# Patient Record
Sex: Male | Born: 1960 | Race: White | Hispanic: No | Marital: Married | Smoking: Never smoker
Health system: Southern US, Community
[De-identification: ages and names within clinical notes are randomized; demographics above are authoritative.]

## PROBLEM LIST (undated history)

## (undated) DIAGNOSIS — Z973 Presence of spectacles and contact lenses: Secondary | ICD-10-CM

## (undated) DIAGNOSIS — Z87442 Personal history of urinary calculi: Secondary | ICD-10-CM

## (undated) DIAGNOSIS — C801 Malignant (primary) neoplasm, unspecified: Secondary | ICD-10-CM

## (undated) DIAGNOSIS — S62509A Fracture of unspecified phalanx of unspecified thumb, initial encounter for closed fracture: Secondary | ICD-10-CM

## (undated) DIAGNOSIS — F32A Depression, unspecified: Secondary | ICD-10-CM

## (undated) HISTORY — DX: Fracture of unspecified phalanx of unspecified thumb, initial encounter for closed fracture: S62.509A

## (undated) HISTORY — PX: OTHER SURGICAL HISTORY: SHX169

---

## 1993-03-01 HISTORY — PX: ORIF FINGER / THUMB FRACTURE: SUR932

## 2009-09-29 DIAGNOSIS — D229 Melanocytic nevi, unspecified: Secondary | ICD-10-CM

## 2009-09-29 HISTORY — DX: Melanocytic nevi, unspecified: D22.9

## 2014-05-10 ENCOUNTER — Encounter: Payer: Self-pay | Admitting: Internal Medicine

## 2014-07-12 ENCOUNTER — Ambulatory Visit (AMBULATORY_SURGERY_CENTER): Payer: Self-pay | Admitting: *Deleted

## 2014-07-12 VITALS — Ht 67.0 in | Wt 163.0 lb

## 2014-07-12 DIAGNOSIS — Z1211 Encounter for screening for malignant neoplasm of colon: Secondary | ICD-10-CM

## 2014-07-12 MED ORDER — MOVIPREP 100 G PO SOLR: 1.0000 | Freq: Once | ORAL | Status: DC

## 2014-07-12 NOTE — Progress Notes (Signed)
Denies allergies to eggs or soy products. Denies complications with sedation or anesthesia. Denies O2 use. Denies use of diet or weight loss medications.  Emmi instructions given for colonoscopy.  

## 2014-07-26 ENCOUNTER — Encounter: Payer: Self-pay | Admitting: Internal Medicine

## 2014-07-26 ENCOUNTER — Telehealth: Payer: Self-pay | Admitting: *Deleted

## 2014-07-26 ENCOUNTER — Other Ambulatory Visit (INDEPENDENT_AMBULATORY_CARE_PROVIDER_SITE_OTHER): Payer: Commercial Managed Care - PPO

## 2014-07-26 ENCOUNTER — Other Ambulatory Visit: Payer: Self-pay | Admitting: *Deleted

## 2014-07-26 ENCOUNTER — Emergency Department (HOSPITAL_COMMUNITY): Payer: Commercial Managed Care - PPO

## 2014-07-26 ENCOUNTER — Ambulatory Visit (INDEPENDENT_AMBULATORY_CARE_PROVIDER_SITE_OTHER)
Admission: RE | Admit: 2014-07-26 | Discharge: 2014-07-26 | Disposition: A | Discharge status: Home / Self Care | Payer: Commercial Managed Care - PPO | Source: Ambulatory Visit | Attending: Internal Medicine | Admitting: Internal Medicine

## 2014-07-26 ENCOUNTER — Emergency Department (HOSPITAL_COMMUNITY)
Admission: EM | Admit: 2014-07-26 | Discharge: 2014-07-26 | Disposition: A | Discharge status: Home / Self Care | Payer: Commercial Managed Care - PPO | Attending: Emergency Medicine | Admitting: Emergency Medicine

## 2014-07-26 ENCOUNTER — Ambulatory Visit (AMBULATORY_SURGERY_CENTER): Payer: Commercial Managed Care - PPO | Admitting: Internal Medicine

## 2014-07-26 ENCOUNTER — Encounter (HOSPITAL_COMMUNITY): Payer: Self-pay | Admitting: Emergency Medicine

## 2014-07-26 VITALS — BP 113/70 | HR 47 | Temp 96.0°F | Resp 37 | Ht 67.0 in | Wt 163.0 lb

## 2014-07-26 DIAGNOSIS — R109 Unspecified abdominal pain: Secondary | ICD-10-CM

## 2014-07-26 DIAGNOSIS — Z9889 Other specified postprocedural states: Secondary | ICD-10-CM

## 2014-07-26 DIAGNOSIS — Z8781 Personal history of (healed) traumatic fracture: Secondary | ICD-10-CM | POA: Diagnosis not present

## 2014-07-26 DIAGNOSIS — Z7982 Long term (current) use of aspirin: Secondary | ICD-10-CM | POA: Diagnosis not present

## 2014-07-26 DIAGNOSIS — R1030 Lower abdominal pain, unspecified: Secondary | ICD-10-CM | POA: Diagnosis present

## 2014-07-26 DIAGNOSIS — Z1211 Encounter for screening for malignant neoplasm of colon: Secondary | ICD-10-CM | POA: Diagnosis not present

## 2014-07-26 DIAGNOSIS — R1084 Generalized abdominal pain: Secondary | ICD-10-CM | POA: Diagnosis not present

## 2014-07-26 LAB — CBC WITH DIFFERENTIAL/PLATELET
Basophils Absolute: 0 10*3/uL (ref 0.0–0.1)
Basophils Relative: 0.6 % (ref 0.0–3.0)
EOS PCT: 7.8 % — AB (ref 0.0–5.0)
Eosinophils Absolute: 0.4 10*3/uL (ref 0.0–0.7)
HEMATOCRIT: 44.5 % (ref 39.0–52.0)
Hemoglobin: 15.2 g/dL (ref 13.0–17.0)
LYMPHS ABS: 1.5 10*3/uL (ref 0.7–4.0)
LYMPHS PCT: 27.1 % (ref 12.0–46.0)
MCHC: 34.1 g/dL (ref 30.0–36.0)
MCV: 91.9 fl (ref 78.0–100.0)
MONO ABS: 0.5 10*3/uL (ref 0.1–1.0)
Monocytes Relative: 8.5 % (ref 3.0–12.0)
NEUTROS PCT: 56 % (ref 43.0–77.0)
Neutro Abs: 3.2 10*3/uL (ref 1.4–7.7)
Platelets: 270 10*3/uL (ref 150.0–400.0)
RBC: 4.84 Mil/uL (ref 4.22–5.81)
RDW: 12.9 % (ref 11.5–15.5)
WBC: 5.7 10*3/uL (ref 4.0–10.5)

## 2014-07-26 MED ORDER — IOHEXOL 300 MG/ML  SOLN
100.0000 mL | Freq: Once | INTRAMUSCULAR | Status: AC | PRN
Start: 2014-07-26 — End: 2014-07-26
  Administered 2014-07-26: 100 mL via INTRAVENOUS

## 2014-07-26 MED ORDER — SODIUM CHLORIDE 0.9 % IV SOLN: 500.0000 mL | INTRAVENOUS | Status: DC

## 2014-07-26 MED ORDER — IOHEXOL 300 MG/ML  SOLN
50.0000 mL | Freq: Once | INTRAMUSCULAR | Status: AC | PRN
  Administered 2014-07-26: 50 mL via ORAL

## 2014-07-26 NOTE — Patient Instructions (Signed)
YOU HAD AN ENDOSCOPIC PROCEDURE TODAY AT [facility]:   Refer to the procedure report that was given to you for any specific questions about what was found during the examination.  If the procedure report does not answer your questions, please call your gastroenterologist to clarify.  If you requested that your care partner not be given the details of your procedure findings, then the procedure report has been included in a sealed envelope for you to review at your convenience later.  YOU SHOULD EXPECT: Some feelings of bloating in the abdomen. Passage of more gas than usual.  Walking can help get rid of the air that was put into your GI tract during the procedure and reduce the bloating. If you had a lower endoscopy (such as a colonoscopy or flexible sigmoidoscopy) you may notice spotting of blood in your stool or on the toilet paper. If you underwent a bowel prep for your procedure, you may not have a normal bowel movement for a few days.  Please Note:  You might notice some irritation and congestion in your nose or some drainage.  This is from the oxygen used during your procedure.  There is no need for concern and it should clear up in a day or so.  SYMPTOMS TO REPORT IMMEDIATELY:   Following lower endoscopy (colonoscopy or flexible sigmoidoscopy):  Excessive amounts of blood in the stool  Significant tenderness or worsening of abdominal pains  Swelling of the abdomen that is new, acute  Fever of 100F or higher   For urgent or emergent issues, a gastroenterologist can be reached at any hour by calling [phone].   DIET: Your first meal following the procedure should be a small meal and then it is ok to progress to your normal diet. Heavy or fried foods are harder to digest and may make you feel nauseous or bloated.  Likewise, meals heavy in dairy and vegetables can increase bloating.  Drink plenty of fluids but you should avoid alcoholic beverages for 24  hours.  ACTIVITY:  You should plan to take it easy for the rest of today and you should NOT DRIVE or use heavy machinery until tomorrow (because of the sedation medicines used during the test).    FOLLOW UP: Our staff will call the number listed on your records the next business day following your procedure to check on you and address any questions or concerns that you may have regarding the information given to you following your procedure. If we do not reach you, we will leave a message.  However, if you are feeling well and you are not experiencing any problems, there is no need to return our call.  We will assume that you have returned to your regular daily activities without incident.  If any biopsies were taken you will be contacted by phone or by letter within the next 1-3 weeks.  Please call us at [phone] if you have not heard about the biopsies in 3 weeks.    SIGNATURES/CONFIDENTIALITY: You and/or your care partner have signed paperwork which will be entered into your electronic medical record.  These signatures attest to the fact that that the information above on your After Visit Summary has been reviewed and is understood.  Full responsibility of the confidentiality of this discharge information lies with you and/or your care-partner.  High fiber diet information given.  Next colonoscopy 10 years-2026.

## 2014-07-26 NOTE — ED Notes (Signed)
Pt being sent by Olevia Perches MD w/ GI r/o perforated bowel.  C/o abdominal pain.  Pt had a colonoscopy this morning.  MD sent Pt for an abdominal xray which showed free air.

## 2014-07-26 NOTE — Progress Notes (Signed)
A/ox3 pleased with MAC, report to Jane RN 

## 2014-07-26 NOTE — Telephone Encounter (Signed)
Pt seen in ED by me. He is feeling much better, he expelled lot of air, and now feeling OK. CT scan of the abdomen shows no worrisome process. He was sent home from ED. I have left a message on his wife's phone about negative CT scan and gave him instructions to go ahead and resume regular diet

## 2014-07-26 NOTE — Op Note (Addendum)
[place]  [address] [place], [address]   COLONOSCOPY PROCEDURE REPORT  PATIENT: Scott Hines, Scott Hines  MR#: [id number] BIRTHDATE: Jun 13, 1960 , 54  yrs. old GENDER: male ENDOSCOPIST: Lafayette Dragon, MD REFERRED TZ:GYFVCB Ehinger, M.D. PROCEDURE DATE:  07/26/2014 PROCEDURE:   Colonoscopy, screening First Screening Colonoscopy - Avg.  risk and is 50 yrs.  old or older Yes.  Prior Negative Screening - Now for repeat screening. N/A  History of Adenoma - Now for follow-up colonoscopy & has been > or = to 3 yrs.  Yes hx of adenoma.  Has been 3 or more years since last colonoscopy.  Polyps removed today? No Recommend repeat exam, <10 yrs? No ASA CLASS:   Class I INDICATIONS:Screening for colonic neoplasia and Colorectal Neoplasm Risk Assessment for this procedure is average risk. MEDICATIONS: Monitored anesthesia care and Propofol 300 mg IV  DESCRIPTION OF PROCEDURE:   After the risks benefits and alternatives of the procedure were thoroughly explained, informed consent was obtained.  The digital rectal exam revealed no abnormalities of the rectum.   The LB [id number]-H190 [id number]  endoscope was introduced through the anus and advanced to the cecum, which was identified by both the appendix and ileocecal valve. No adverse events experienced.   The quality of the prep was good.  (MoviPrep was used)  The instrument was then slowly withdrawn as the colon was fully examined. Estimated blood loss is zero unless otherwise noted in this procedure report.      COLON FINDINGS: A normal appearing cecum, ileocecal valve, and appendiceal orifice were identified.  The ascending, transverse, descending, sigmoid colon, and rectum appeared unremarkable. This was a difficult procedure in that [place] was difficult to advance beyond hepatic flexure. Patient was turned in the left then right decubital position in order to advance the scope, abdominal pressure was applied Retroflexed views  revealed no abnormalities. The time to cecum = 15.29 Withdrawal time = 6.10   The scope was withdrawn and the procedure completed. COMPLICATIONS: There were no immediate complications.  ENDOSCOPIC IMPRESSION: Normal colonoscopy  RECOMMENDATIONS: High fiber diet Recall colonoscopy in 10 years following the procedure patient experience intermittent abdominal pain and was sent to the lab for CBC and KUB which showed the distended colon with overlapping loops of the small and large intestine with question of intra-luminal air into descending colon. Patient was asked to go to emergency room for CT scan of the abdomen but declined. We have instructed him and his wife to go to emergency room if the pain does not subside in a short period of time. His blood pressure and pulse were stable when he was discharged from recovery.  eSigned:  Lafayette Dragon, MD 07/26/2014 12:44 PM Revised: 07/26/2014 12:44 PM  cc:

## 2014-07-26 NOTE — Progress Notes (Signed)
BP   124/80 prior to discharge.

## 2014-07-26 NOTE — ED Notes (Addendum)
Patient comes from home after having a colonscopy this morning.  Patient had a lot of pain after the colonscopy and GI provider ordered a KUB which is concerning for free air.  Patient states he was in a great deal of pain earlier, but after having flatus he felt better.  Patient currently denies pain and N/V.  Patient's abdomen is soft and bowel sounds are hyperactive.

## 2014-07-26 NOTE — ED Notes (Signed)
Pt complaint of severe abdominal pain post colonoscopy this morning.

## 2014-07-26 NOTE — ED Provider Notes (Signed)
CSN: [id number]     Arrival date & time 07/26/14  1203 History   None    Chief Complaint  Patient presents with  . Abdominal Pain     (Consider location/radiation/quality/duration/timing/severity/associated sxs/prior Treatment) HPI Comments: Patient is a 54 year old male who presents with abdominal pain following a colonoscopy that he had this morning. The pain is located in his lower abdomen and does not radiate. The pain is described as pressure and moderate to severe. The pain started gradually and progressively worsened since the onset. Dr. Olevia Perches performed the colonoscopy and ordered a KUB which show distention but no free air. She sent the patient to the ED for a CT scan. No alleviating/aggravating factors. The patient has tried nothing for symptoms without relief. Associated symptoms include abdominal distention. Patient denies fever, headache, NVD, chest pain, SOB, dysuria, constipation.    Past Medical History  Diagnosis Date  . Thumb fracture     right   Past Surgical History  Procedure Laterality Date  . Orif finger / thumb fracture Right 1995   Family History  Problem Relation Age of Onset  . Colon polyps Sister   . Colon polyps Brother   . Colon cancer Neg Hx    History  Substance Use Topics  . Smoking status: Never Smoker   . Smokeless tobacco: Never Used  . Alcohol Use: 2.4 oz/week    4 Glasses of wine per week    Review of Systems  Constitutional: Negative for fever, chills and fatigue.  HENT: Negative for trouble swallowing.   Eyes: Negative for visual disturbance.  Respiratory: Negative for shortness of breath.   Cardiovascular: Negative for chest pain and palpitations.  Gastrointestinal: Positive for abdominal pain. Negative for nausea, vomiting and diarrhea.  Genitourinary: Negative for dysuria and difficulty urinating.  Musculoskeletal: Negative for arthralgias and neck pain.  Skin: Negative for color change.  Neurological: Negative for dizziness and  weakness.  Psychiatric/Behavioral: Negative for dysphoric mood.      Allergies  Review of patient's allergies indicates no known allergies.  Home Medications   Prior to Admission medications   Medication Sig Start Date End Date Taking? Authorizing Provider  aspirin 81 MG tablet Take 81 mg by mouth daily.   Yes Historical Provider, MD  ibuprofen (ADVIL,MOTRIN) 200 MG tablet Take 400 mg by mouth every 6 (six) hours as needed for headache, mild pain or moderate pain.   Yes Historical Provider, MD   BP 139/93 mmHg  Pulse 61  Temp(Src) 97.8 F (36.6 C) (Oral)  Resp 16  SpO2 98% Physical Exam  Constitutional: He is oriented to person, place, and time. He appears well-developed and well-nourished. No distress.  HENT:  Head: Normocephalic and atraumatic.  Eyes: Conjunctivae and EOM are normal.  Neck: Normal range of motion.  Cardiovascular: Normal rate and regular rhythm.  Exam reveals no gallop and no friction rub.   No murmur heard. Pulmonary/Chest: Effort normal and breath sounds normal. He has no wheezes. He has no rales. He exhibits no tenderness.  Abdominal: Soft. He exhibits distension. There is tenderness. There is no rebound.  Mild LLQ and RLQ tenderness to palpation. Abdominal distension noted. No peritoneal signs.   Musculoskeletal: Normal range of motion.  Neurological: He is alert and oriented to person, place, and time. Coordination normal.  Speech is goal-oriented. Moves limbs without ataxia.   Skin: Skin is warm and dry.  Psychiatric: He has a normal mood and affect. His behavior is normal.  Nursing note and  vitals reviewed.   ED Course  Procedures (including critical care time) Labs Review Labs Reviewed - No data to display  Imaging Review Dg Abd 1 View  07/26/2014   CLINICAL DATA:  Left lower quadrant discomfort following colonoscopy earlier today  EXAM: ABDOMEN - 1 VIEW  COMPARISON:  None.  FINDINGS: There is moderate gaseous distention of the entire colon.  A small amount of small bowel gas is demonstrated as well on the left there tiny gas collections adjacent to the descending colon which may lie in overlapping loops of small bowel or possibly be intramural air. There is gas in the rectum. Hemidiaphragms are not included in the field of view.  IMPRESSION: There is significant gaseous distention of the colon. Definite free extraluminal collections are not demonstrated but intramural gas in the descending colon may be present. CT scanning of the abdomen and pelvis is recommended.  These results were called by telephone at the time of interpretation on 07/26/2014 at 11:26 am to [facility], RN, who verbally acknowledged these results.   Electronically Signed   By: David  Martinique M.D.   On: 07/26/2014 11:27     EKG Interpretation None      MDM   Final diagnoses:  Generalized abdominal pain  Status post colonoscopy    12:18 PM Labs pending.   2:17 PM CT abdomen pelvis unremarkable for acute changes. Patient is pain free and will be discharged. Vitals stable and patient afebrile.   Alvina Chou, PA-C 07/26/14 [place], MD 07/26/14 1421

## 2014-07-26 NOTE — ED Notes (Signed)
Patient transported to CT 

## 2014-07-26 NOTE — Telephone Encounter (Signed)
Told Ms. Lanae Boast to take patient to [facility] ED for evaluation and possible CT scan as per Dr. Olevia Perches. ER triage made aware patient is coming.

## 2014-07-26 NOTE — Progress Notes (Signed)
1045 pt. Complains of mid abdominal pain and has not passed a lot of air.  Abdomen distended and firm.  Dr. Olevia Perches in to examine pt.   Will go to lab for CBC And KUB Stat.  Pt. To go home, but will call back if needed. Will advise pt. Of results of labs and KUB asap.

## 2014-07-30 ENCOUNTER — Telehealth: Payer: Self-pay | Admitting: *Deleted

## 2014-07-30 NOTE — Telephone Encounter (Signed)
  Follow up Call-no answer, left message to call if questions or concerns.     

## 2017-01-18 DIAGNOSIS — D229 Melanocytic nevi, unspecified: Secondary | ICD-10-CM | POA: Diagnosis not present

## 2017-01-18 DIAGNOSIS — L739 Follicular disorder, unspecified: Secondary | ICD-10-CM | POA: Diagnosis not present

## 2017-01-18 DIAGNOSIS — D492 Neoplasm of unspecified behavior of bone, soft tissue, and skin: Secondary | ICD-10-CM | POA: Diagnosis not present

## 2017-01-18 DIAGNOSIS — L821 Other seborrheic keratosis: Secondary | ICD-10-CM | POA: Diagnosis not present

## 2017-01-18 DIAGNOSIS — L820 Inflamed seborrheic keratosis: Secondary | ICD-10-CM | POA: Diagnosis not present

## 2017-01-27 DIAGNOSIS — F332 Major depressive disorder, recurrent severe without psychotic features: Secondary | ICD-10-CM | POA: Diagnosis not present

## 2017-02-03 DIAGNOSIS — F332 Major depressive disorder, recurrent severe without psychotic features: Secondary | ICD-10-CM | POA: Diagnosis not present

## 2017-03-15 DIAGNOSIS — F332 Major depressive disorder, recurrent severe without psychotic features: Secondary | ICD-10-CM | POA: Diagnosis not present

## 2017-07-04 DIAGNOSIS — F332 Major depressive disorder, recurrent severe without psychotic features: Secondary | ICD-10-CM | POA: Diagnosis not present

## 2017-08-01 DIAGNOSIS — F332 Major depressive disorder, recurrent severe without psychotic features: Secondary | ICD-10-CM | POA: Diagnosis not present

## 2017-08-15 DIAGNOSIS — F332 Major depressive disorder, recurrent severe without psychotic features: Secondary | ICD-10-CM | POA: Diagnosis not present

## 2017-08-26 DIAGNOSIS — F332 Major depressive disorder, recurrent severe without psychotic features: Secondary | ICD-10-CM | POA: Diagnosis not present

## 2018-01-17 DIAGNOSIS — F332 Major depressive disorder, recurrent severe without psychotic features: Secondary | ICD-10-CM | POA: Diagnosis not present

## 2018-02-16 DIAGNOSIS — E7800 Pure hypercholesterolemia, unspecified: Secondary | ICD-10-CM | POA: Diagnosis not present

## 2018-02-16 DIAGNOSIS — Z125 Encounter for screening for malignant neoplasm of prostate: Secondary | ICD-10-CM | POA: Diagnosis not present

## 2018-02-16 DIAGNOSIS — M792 Neuralgia and neuritis, unspecified: Secondary | ICD-10-CM | POA: Diagnosis not present

## 2018-02-16 DIAGNOSIS — Z0000 Encounter for general adult medical examination without abnormal findings: Secondary | ICD-10-CM | POA: Diagnosis not present

## 2018-10-04 DIAGNOSIS — L821 Other seborrheic keratosis: Secondary | ICD-10-CM | POA: Diagnosis not present

## 2018-10-04 DIAGNOSIS — D229 Melanocytic nevi, unspecified: Secondary | ICD-10-CM | POA: Diagnosis not present

## 2018-10-04 DIAGNOSIS — L728 Other follicular cysts of the skin and subcutaneous tissue: Secondary | ICD-10-CM | POA: Diagnosis not present

## 2018-12-13 DIAGNOSIS — Z23 Encounter for immunization: Secondary | ICD-10-CM | POA: Diagnosis not present

## 2018-12-18 DIAGNOSIS — M25512 Pain in left shoulder: Secondary | ICD-10-CM | POA: Diagnosis not present

## 2019-01-01 DIAGNOSIS — M25512 Pain in left shoulder: Secondary | ICD-10-CM | POA: Diagnosis not present

## 2019-09-10 ENCOUNTER — Encounter: Payer: Self-pay | Admitting: *Deleted

## 2019-09-18 ENCOUNTER — Ambulatory Visit (INDEPENDENT_AMBULATORY_CARE_PROVIDER_SITE_OTHER): Payer: 59 | Admitting: Dermatology

## 2019-09-18 ENCOUNTER — Other Ambulatory Visit: Payer: Self-pay

## 2019-09-18 ENCOUNTER — Encounter: Payer: Self-pay | Admitting: Dermatology

## 2019-09-18 DIAGNOSIS — L814 Other melanin hyperpigmentation: Secondary | ICD-10-CM | POA: Diagnosis not present

## 2019-09-18 DIAGNOSIS — D225 Melanocytic nevi of trunk: Secondary | ICD-10-CM

## 2019-09-18 DIAGNOSIS — D229 Melanocytic nevi, unspecified: Secondary | ICD-10-CM

## 2019-09-18 DIAGNOSIS — Z1283 Encounter for screening for malignant neoplasm of skin: Secondary | ICD-10-CM | POA: Diagnosis not present

## 2019-09-18 MED ORDER — TERBINAFINE HCL 1 % EX CREA: TOPICAL_CREAM | CUTANEOUS | 1 refills | Status: DC

## 2019-10-19 ENCOUNTER — Encounter: Payer: Self-pay | Admitting: Dermatology

## 2019-10-19 NOTE — Progress Notes (Signed)
   Follow-Up Visit   Subjective  Scott Hines is a 59 y.o. male who presents for the following: Annual Exam (irritation on small toe left foot, has treated with over the counter athletes foot spray but has not helped, has been present for 2 months now).  General skin examination Location:  Duration:  Quality: Without known changes in moles. Associated Signs/Symptoms: Modifying Factors:  Severity:  Timing: Context: Also wants his athlete's foot checked.  Objective  Well appearing patient in no apparent distress; mood and affect are within normal limits.  A full examination was performed including scalp, head, eyes, ears, nose, lips, neck, chest, axillae, abdomen, back, buttocks, bilateral upper extremities, bilateral lower extremities, hands, feet, fingers, toes, fingernails, and toenails. All findings within normal limits unless otherwise noted below.   Assessment & Plan    Solar lentigo Mid Frontal Scalp  Recheck if any clinical change.  Did discuss biopsy but for now patient prefers to watch.  Nevus Mid Back  Self examine twice annually, dermatologist examination yearly.  Skin cancer screening performed today.    I, Lavonna Monarch, MD, have reviewed all documentation for this visit.  The documentation on 10/19/19 for the exam, diagnosis, procedures, and orders are all accurate and complete.

## 2021-06-11 ENCOUNTER — Ambulatory Visit
Admission: RE | Admit: 2021-06-11 | Discharge: 2021-06-11 | Disposition: A | Discharge status: Home / Self Care | Payer: 59 | Source: Ambulatory Visit | Attending: Family Medicine | Admitting: Family Medicine

## 2021-06-11 ENCOUNTER — Other Ambulatory Visit: Payer: Self-pay | Admitting: Family Medicine

## 2021-06-11 DIAGNOSIS — R109 Unspecified abdominal pain: Secondary | ICD-10-CM | POA: Diagnosis not present

## 2021-06-24 ENCOUNTER — Other Ambulatory Visit: Payer: Self-pay | Admitting: Family Medicine

## 2021-06-24 DIAGNOSIS — N200 Calculus of kidney: Secondary | ICD-10-CM

## 2021-06-25 ENCOUNTER — Inpatient Hospital Stay: Admission: RE | Admit: 2021-06-25 | Payer: 59 | Source: Ambulatory Visit

## 2021-07-01 ENCOUNTER — Ambulatory Visit
Admission: RE | Admit: 2021-07-01 | Discharge: 2021-07-01 | Disposition: A | Discharge status: Home / Self Care | Payer: 59 | Source: Ambulatory Visit | Attending: Family Medicine | Admitting: Family Medicine

## 2021-07-01 DIAGNOSIS — N200 Calculus of kidney: Secondary | ICD-10-CM

## 2021-07-01 DIAGNOSIS — N1330 Unspecified hydronephrosis: Secondary | ICD-10-CM | POA: Diagnosis not present

## 2021-10-13 DIAGNOSIS — N130 Hydronephrosis with ureteropelvic junction obstruction: Secondary | ICD-10-CM | POA: Diagnosis not present

## 2021-10-13 DIAGNOSIS — R972 Elevated prostate specific antigen [PSA]: Secondary | ICD-10-CM | POA: Diagnosis not present

## 2021-10-16 ENCOUNTER — Other Ambulatory Visit: Payer: Self-pay | Admitting: Urology

## 2021-10-16 DIAGNOSIS — N1330 Unspecified hydronephrosis: Secondary | ICD-10-CM

## 2021-10-27 ENCOUNTER — Ambulatory Visit
Admission: RE | Admit: 2021-10-27 | Discharge: 2021-10-27 | Disposition: A | Discharge status: Home / Self Care | Payer: 59 | Source: Ambulatory Visit | Attending: Urology | Admitting: Urology

## 2021-10-27 DIAGNOSIS — N1330 Unspecified hydronephrosis: Secondary | ICD-10-CM

## 2021-10-27 DIAGNOSIS — N200 Calculus of kidney: Secondary | ICD-10-CM | POA: Diagnosis not present

## 2021-10-27 DIAGNOSIS — K760 Fatty (change of) liver, not elsewhere classified: Secondary | ICD-10-CM | POA: Diagnosis not present

## 2021-11-05 ENCOUNTER — Other Ambulatory Visit: Payer: Self-pay | Admitting: Urology

## 2021-11-05 ENCOUNTER — Other Ambulatory Visit (HOSPITAL_COMMUNITY): Payer: Self-pay | Admitting: Urology

## 2021-11-05 DIAGNOSIS — R972 Elevated prostate specific antigen [PSA]: Secondary | ICD-10-CM

## 2021-11-06 ENCOUNTER — Other Ambulatory Visit: Payer: Self-pay | Admitting: Urology

## 2021-11-18 NOTE — Progress Notes (Signed)
Left message with zona at dr Louis Meckel office, pt has been left voice mail message x 4 and has not called back to pre op nurse to do medical history, instructions etc for 11-27-2021 surgery.

## 2021-11-19 NOTE — Progress Notes (Signed)
Zona at dr Louis Meckel office called, no other contact numbers for patient

## 2021-11-20 NOTE — Progress Notes (Signed)
Left message with Zona at dr Louis Meckel, pt has not returned pre op call for 11-27-2021 surgery.

## 2021-11-24 ENCOUNTER — Other Ambulatory Visit: Payer: Self-pay | Admitting: Urology

## 2021-11-24 ENCOUNTER — Other Ambulatory Visit: Payer: Self-pay

## 2021-11-24 ENCOUNTER — Encounter (HOSPITAL_BASED_OUTPATIENT_CLINIC_OR_DEPARTMENT_OTHER): Payer: Self-pay | Admitting: Urology

## 2021-11-24 NOTE — Progress Notes (Signed)
Spoke w/ via phone for pre-op interview---pt Lab needs dos----none               Lab results------none COVID test -----patient states asymptomatic no test needed Arrive at -------530 am  NPO after MN NO Solid Food.  Clear liquids from MN until---430 am Med rec completed Medications to take morning of surgery -----none Diabetic medication ----- Patient instructed no nail polish to be worn day of surgery Patient instructed to bring photo id and insurance card day of surgery Patient aware to have Driver (ride ) / caregiver    for 24 hours after surgery  Patient Special Instructions -----fleets enema am of surgery per dr Louis Meckel instructions Pre-Op special Istructions -----none Patient verbalized understanding of instructions that were given at this phone interview. Patient denies shortness of breath, chest pain, fever, cough at this phone interview.

## 2021-11-26 NOTE — Anesthesia Preprocedure Evaluation (Addendum)
Anesthesia Evaluation  Patient identified by MRN, date of birth, ID band Patient awake    Reviewed: Allergy & Precautions, NPO status , Patient's Chart, lab work & pertinent test results  Airway Mallampati: I  TM Distance: >3 FB Neck ROM: Full    Dental no notable dental hx.    Pulmonary neg pulmonary ROS,    Pulmonary exam normal        Cardiovascular negative cardio ROS Normal cardiovascular exam     Neuro/Psych negative neurological ROS  negative psych ROS   GI/Hepatic negative GI ROS, Neg liver ROS,   Endo/Other  negative endocrine ROS  Renal/GU negative Renal ROS     Musculoskeletal negative musculoskeletal ROS (+)   Abdominal   Peds  Hematology negative hematology ROS (+)   Anesthesia Other Findings RIGHT DISTAL URETERAL STONE/ELEVATED PSA  Reproductive/Obstetrics                            Anesthesia Physical Anesthesia Plan  ASA: 2  Anesthesia Plan: General   Post-op Pain Management:    Induction: Intravenous  PONV Risk Score and Plan: 2 and Ondansetron, Dexamethasone, Midazolam and Treatment may vary due to age or medical condition  Airway Management Planned: LMA  Additional Equipment:   Intra-op Plan:   Post-operative Plan: Extubation in OR  Informed Consent: I have reviewed the patients History and Physical, chart, labs and discussed the procedure including the risks, benefits and alternatives for the proposed anesthesia with the patient or authorized representative who has indicated his/her understanding and acceptance.     Dental advisory given  Plan Discussed with: CRNA  Anesthesia Plan Comments:         Anesthesia Quick Evaluation

## 2021-11-27 ENCOUNTER — Encounter (HOSPITAL_BASED_OUTPATIENT_CLINIC_OR_DEPARTMENT_OTHER)
Admission: RE | Disposition: A | Discharge status: Home / Self Care | Payer: Self-pay | Source: Home / Self Care | Attending: Urology

## 2021-11-27 ENCOUNTER — Ambulatory Visit (HOSPITAL_COMMUNITY)
Admission: RE | Admit: 2021-11-27 | Discharge: 2021-11-27 | Disposition: A | Discharge status: Home / Self Care | Payer: 59 | Source: Ambulatory Visit | Attending: Urology | Admitting: Urology

## 2021-11-27 ENCOUNTER — Encounter (HOSPITAL_BASED_OUTPATIENT_CLINIC_OR_DEPARTMENT_OTHER): Payer: Self-pay | Admitting: Urology

## 2021-11-27 ENCOUNTER — Ambulatory Visit (HOSPITAL_BASED_OUTPATIENT_CLINIC_OR_DEPARTMENT_OTHER): Payer: 59 | Admitting: Anesthesiology

## 2021-11-27 ENCOUNTER — Ambulatory Visit (HOSPITAL_BASED_OUTPATIENT_CLINIC_OR_DEPARTMENT_OTHER)
Admission: RE | Admit: 2021-11-27 | Discharge: 2021-11-27 | Disposition: A | Discharge status: Home / Self Care | Payer: 59 | Attending: Urology | Admitting: Urology

## 2021-11-27 DIAGNOSIS — N201 Calculus of ureter: Secondary | ICD-10-CM

## 2021-11-27 DIAGNOSIS — R972 Elevated prostate specific antigen [PSA]: Secondary | ICD-10-CM

## 2021-11-27 DIAGNOSIS — C61 Malignant neoplasm of prostate: Secondary | ICD-10-CM | POA: Insufficient documentation

## 2021-11-27 DIAGNOSIS — Z01818 Encounter for other preprocedural examination: Secondary | ICD-10-CM

## 2021-11-27 DIAGNOSIS — D075 Carcinoma in situ of prostate: Secondary | ICD-10-CM | POA: Diagnosis not present

## 2021-11-27 DIAGNOSIS — N200 Calculus of kidney: Secondary | ICD-10-CM

## 2021-11-27 DIAGNOSIS — N132 Hydronephrosis with renal and ureteral calculous obstruction: Secondary | ICD-10-CM | POA: Diagnosis not present

## 2021-11-27 HISTORY — DX: Personal history of urinary calculi: Z87.442

## 2021-11-27 HISTORY — DX: Presence of spectacles and contact lenses: Z97.3

## 2021-11-27 HISTORY — PX: TRANSRECTAL ULTRASOUND: SHX5146

## 2021-11-27 HISTORY — PX: CYSTOSCOPY/URETEROSCOPY/HOLMIUM LASER/STENT PLACEMENT: SHX6546

## 2021-11-27 SURGERY — CYSTOSCOPY/URETEROSCOPY/HOLMIUM LASER/STENT PLACEMENT
Anesthesia: General | Site: Ureter | Laterality: Right

## 2021-11-27 MED ORDER — KETOROLAC TROMETHAMINE 30 MG/ML IJ SOLN: 30.0000 mg | Freq: Once | INTRAMUSCULAR | Status: DC | PRN

## 2021-11-27 MED ORDER — OXYCODONE HCL 5 MG/5ML PO SOLN: 5.0000 mg | Freq: Once | ORAL | Status: DC | PRN

## 2021-11-27 MED ORDER — FENTANYL CITRATE (PF) 100 MCG/2ML IJ SOLN: 25.0000 ug | INTRAMUSCULAR | Status: DC | PRN

## 2021-11-27 MED ORDER — ACETAMINOPHEN 500 MG PO TABS
1000.0000 mg | ORAL_TABLET | Freq: Once | ORAL | Status: AC
  Administered 2021-11-27: 1000 mg via ORAL

## 2021-11-27 MED ORDER — FENTANYL CITRATE (PF) 250 MCG/5ML IJ SOLN
INTRAMUSCULAR | Status: DC | PRN
  Administered 2021-11-27 (×2): 25 ug via INTRAVENOUS
  Administered 2021-11-27: 50 ug via INTRAVENOUS

## 2021-11-27 MED ORDER — MIDAZOLAM HCL 2 MG/2ML IJ SOLN
INTRAMUSCULAR | Status: DC | PRN
  Administered 2021-11-27: 2 mg via INTRAVENOUS

## 2021-11-27 MED ORDER — TRAMADOL HCL 50 MG PO TABS: 50.0000 mg | ORAL_TABLET | Freq: Four times a day (QID) | ORAL | 0 refills | Status: DC | PRN

## 2021-11-27 MED ORDER — TAMSULOSIN HCL 0.4 MG PO CAPS: 0.4000 mg | ORAL_CAPSULE | Freq: Every day | ORAL | 0 refills | Status: DC

## 2021-11-27 MED ORDER — LIDOCAINE 2% (20 MG/ML) 5 ML SYRINGE
INTRAMUSCULAR | Status: DC | PRN
  Administered 2021-11-27: 60 mg via INTRAVENOUS

## 2021-11-27 MED ORDER — PROPOFOL 10 MG/ML IV BOLUS
INTRAVENOUS | Status: AC
  Filled 2021-11-27: qty 20

## 2021-11-27 MED ORDER — ACETAMINOPHEN 500 MG PO TABS
ORAL_TABLET | ORAL | Status: AC
  Filled 2021-11-27: qty 2

## 2021-11-27 MED ORDER — PROPOFOL 10 MG/ML IV BOLUS
INTRAVENOUS | Status: DC | PRN
  Administered 2021-11-27: 200 mg via INTRAVENOUS

## 2021-11-27 MED ORDER — SODIUM CHLORIDE 0.9 % IV SOLN
INTRAVENOUS | Status: AC
  Filled 2021-11-27: qty 100

## 2021-11-27 MED ORDER — SODIUM CHLORIDE 0.9 % IR SOLN
Status: DC | PRN
  Administered 2021-11-27: 3000 mL via INTRAVESICAL

## 2021-11-27 MED ORDER — AMISULPRIDE (ANTIEMETIC) 5 MG/2ML IV SOLN: 10.0000 mg | Freq: Once | INTRAVENOUS | Status: DC | PRN

## 2021-11-27 MED ORDER — SODIUM CHLORIDE 0.9 % IV SOLN
2.0000 g | INTRAVENOUS | Status: AC
  Administered 2021-11-27: 2 g via INTRAVENOUS

## 2021-11-27 MED ORDER — PROMETHAZINE HCL 25 MG/ML IJ SOLN: 6.2500 mg | INTRAMUSCULAR | Status: DC | PRN

## 2021-11-27 MED ORDER — ONDANSETRON HCL 4 MG/2ML IJ SOLN
INTRAMUSCULAR | Status: DC | PRN
  Administered 2021-11-27: 4 mg via INTRAVENOUS

## 2021-11-27 MED ORDER — MIDAZOLAM HCL 2 MG/2ML IJ SOLN
INTRAMUSCULAR | Status: AC
  Filled 2021-11-27: qty 2

## 2021-11-27 MED ORDER — FENTANYL CITRATE (PF) 100 MCG/2ML IJ SOLN
INTRAMUSCULAR | Status: AC
  Filled 2021-11-27: qty 2

## 2021-11-27 MED ORDER — CIPROFLOXACIN HCL 500 MG PO TABS: 500.0000 mg | ORAL_TABLET | Freq: Once | ORAL | 0 refills | Status: AC

## 2021-11-27 MED ORDER — DEXAMETHASONE SODIUM PHOSPHATE 10 MG/ML IJ SOLN
INTRAMUSCULAR | Status: DC | PRN
  Administered 2021-11-27: 10 mg via INTRAVENOUS

## 2021-11-27 MED ORDER — CEFTRIAXONE SODIUM 2 G IJ SOLR
INTRAMUSCULAR | Status: AC
  Filled 2021-11-27: qty 20

## 2021-11-27 MED ORDER — SODIUM CHLORIDE 0.9 % IV SOLN: INTRAVENOUS | Status: DC

## 2021-11-27 MED ORDER — IOHEXOL 300 MG/ML  SOLN
INTRAMUSCULAR | Status: DC | PRN
  Administered 2021-11-27: 10 mL

## 2021-11-27 MED ORDER — OXYCODONE HCL 5 MG PO TABS: 5.0000 mg | ORAL_TABLET | Freq: Once | ORAL | Status: DC | PRN

## 2021-11-27 SURGICAL SUPPLY — 26 items
APL SKNCLS STERI-STRIP NONHPOA (GAUZE/BANDAGES/DRESSINGS) ×2
BAG DRAIN URO-CYSTO SKYTR STRL (DRAIN) ×2 IMPLANT
BAG DRN UROCATH (DRAIN) ×2
BASKET LASER NITINOL 1.9FR (BASKET) IMPLANT
BASKET STONE 1.7 NGAGE (UROLOGICAL SUPPLIES) IMPLANT
BENZOIN TINCTURE PRP APPL 2/3 (GAUZE/BANDAGES/DRESSINGS) IMPLANT
BSKT STON RTRVL 120 1.9FR (BASKET)
CATH URETERAL DUAL LUMEN 10F (MISCELLANEOUS) IMPLANT
CATH URETL OPEN 5X70 (CATHETERS) ×2 IMPLANT
CLOTH BEACON ORANGE TIMEOUT ST (SAFETY) ×2 IMPLANT
DRSG TEGADERM 2-3/8X2-3/4 SM (GAUZE/BANDAGES/DRESSINGS) IMPLANT
EXTRACTOR STONE 1.7FRX115CM (UROLOGICAL SUPPLIES) IMPLANT
GLOVE BIO SURGEON STRL SZ7.5 (GLOVE) ×2 IMPLANT
GOWN STRL REUS W/TWL XL LVL3 (GOWN DISPOSABLE) ×2 IMPLANT
GUIDEWIRE STR DUAL SENSOR (WIRE) ×2 IMPLANT
INST BIOPSY MAXCORE 18GX25 (NEEDLE) IMPLANT
IV NS IRRIG 3000ML ARTHROMATIC (IV SOLUTION) ×4 IMPLANT
KIT TURNOVER CYSTO (KITS) ×2 IMPLANT
MANIFOLD NEPTUNE II (INSTRUMENTS) ×2 IMPLANT
NS IRRIG 500ML POUR BTL (IV SOLUTION) ×2 IMPLANT
PACK CYSTO (CUSTOM PROCEDURE TRAY) ×2 IMPLANT
STENT URET 6FRX26 CONTOUR (STENTS) IMPLANT
TRACTIP FLEXIVA PULS ID 200XHI (Laser) IMPLANT
TRACTIP FLEXIVA PULSE ID 200 (Laser) ×2
TUBE CONNECTING 12X1/4 (SUCTIONS) IMPLANT
TUBING UROLOGY SET (TUBING) ×2 IMPLANT

## 2021-11-27 NOTE — Transfer of Care (Signed)
Immediate Anesthesia Transfer of Care Note  Patient: Carver Fila  Procedure(s) Performed: RIGHT URETEROSCOPY/HOLMIUM LASER/STONE REMOVAL, RIGHT URETERAL STENT PLACEMENT (Right: Ureter) TRANSRECTAL ULTRASOUND-PROSTATE BIOPSY (Prostate)  Patient Location: PACU  Anesthesia Type:General  Level of Consciousness: drowsy and patient cooperative  Airway & Oxygen Therapy: Patient Spontanous Breathing  Post-op Assessment: Report given to RN and Post -op Vital signs reviewed and stable  Post vital signs: Reviewed and stable  Last Vitals:  Vitals Value Taken Time  BP    Temp    Pulse 56 11/27/21 0848  Resp 11 11/27/21 0848  SpO2 100 % 11/27/21 0848    Last Pain:  Vitals:   11/27/21 0550  TempSrc: Oral  PainSc: 0-No pain      Patients Stated Pain Goal: 5 (16/10/96 0454)  Complications: No notable events documented.

## 2021-11-27 NOTE — Anesthesia Postprocedure Evaluation (Signed)
Anesthesia Post Note  Patient: Scott Hines  Procedure(s) Performed: RIGHT URETEROSCOPY/HOLMIUM LASER/STONE REMOVAL, RIGHT URETERAL STENT PLACEMENT (Right: Ureter) TRANSRECTAL ULTRASOUND-PROSTATE BIOPSY (Prostate)     Patient location during evaluation: PACU Anesthesia Type: General Level of consciousness: awake Pain management: pain level controlled Vital Signs Assessment: post-procedure vital signs reviewed and stable Respiratory status: spontaneous breathing, nonlabored ventilation, respiratory function stable and patient connected to nasal cannula oxygen Cardiovascular status: blood pressure returned to baseline and stable Postop Assessment: no apparent nausea or vomiting Anesthetic complications: no   No notable events documented.  Last Vitals:  Vitals:   11/27/21 1017 11/27/21 1045  BP: (!) 139/91   Pulse: (!) 51   Resp: 18   Temp:  36.4 C  SpO2: 100%     Last Pain:  Vitals:   11/27/21 0915  TempSrc:   PainSc: 0-No pain                 Ande Therrell P Wilhelmena Zea

## 2021-11-27 NOTE — Op Note (Signed)
Preoperative diagnosis:  Elevated PSA Right distal ureteral stone  Postoperative diagnosis:  Same  Procedure: Transrectal ultrasound guided 12 core prostate biopsy Cystoscopy, right retrograde pyelogram with interpretation, right ureteroscopy, laser lithotripsy, stone extraction, right ureteral stent placement  Surgeon: Ardis Hughs, MD  Anesthesia: General  Complications: None  Intraoperative findings:  1: The patient's prostate was approximately 28 g, with a small median lobe.  There were no obvious hypoechoic areas.  12 cores were taken in the routine pattern. #2: The patient's stone was in the right distal ureter and fragmented and removed entirely.  A 26 cm stent was placed. #3: The right retrograde pyelogram demonstrated a filling defect in the distal ureter just proximal to the UVJ, but there was no hydroureteronephrosis.  EBL: Minimal  Specimens:  #1: 12 core needle biopsies of the prostate including bilateral medial and lateral base, medial and lateral mid, medial and apex. #2: Right distal ureteral stone  Indication: Scott Hines is a 61 y.o. patient with right distal ureteral stone otherwise fairly asymptomatic but noted on a CT scan performed after the ultrasound demonstrated mild hydronephrosis.  In addition, the patient had an elevated PSA and needed a prostate biopsy.  Given that he needed to procedures, we opted to proceed to the OR and performed both of them concomitantly..  After reviewing the management options for treatment, he elected to proceed with the above surgical procedure(s). We have discussed the potential benefits and risks of the procedure, side effects of the proposed treatment, the likelihood of the patient achieving the goals of the procedure, and any potential problems that might occur during the procedure or recuperation. Informed consent has been obtained.  Description of procedure:  The patient was taken to the operating room and  general anesthesia was induced.  The patient was then placed in the lateral decubitus position on the stretcher and a transrectal ultrasound probe was placed into his rectum.  The prostate was subsequently imaged as well as measured.  These measurements were recorded and loaded into the PACS system.  There were no significant or unusual findings within the prostate in general.  I then proceeded to perform a 12 core prostate biopsy in the routine fashion taking 6 cores from each side and that medial and lateral base/apex/mid region.  The patient was placed in the dorsal lithotomy position, prepped and draped in the usual sterile fashion, and preoperative antibiotics were administered. A preoperative time-out was performed.   21 French 30 degree cystoscope was then gently passed through the patient's urethra and into the bladder under vision guidance.  Cystoscopy was performed demonstrating bladder mucosa with no mucosal abnormalities.  The ureteral orifice ease were orthotopic.  Using a 5 [place] open-ended ureteral catheter a retrograde pyelogram was performed on the right side with the above findings.  A wire was then advanced through the open-ended catheter and up into the renal pelvis under fluoroscopic guidance.  The bladder was subsequently emptied and the scope removed.  I then passed a ureteroscope through the patient's urethra and cannulated the right ureteral orifice.  The stone was encountered just proximal to the UVJ.  Using a 240 m laser fiber the stone was fragmented into 5 or 6 smaller pieces.  These were then removed with the engage basket.  I advanced the rigid ureteroscope up to the proximal ureter noting no fragments or additional stone.  I then remove the scope noting no significant ureteral trauma.  I then backloaded the safety wire through the  cystoscope and repassed the cystoscope into the patient's bladder.  I subsequently advanced a 26 cm time 6 French double-J stent over the wire and  into the right renal pelvis.  Once it was noted to be well within the renal pelvis I advanced the remainder of the stent into the bladder and once it was at the bladder neck remove the wire noting a nice curl within the kidney and bladder, confirmed by fluoroscopy.  Stent tether was then pulled through the urethral meatus and secured to the dorsum of his penis.  The stone fragments were then removed by repassed the cystoscope alongside the strength and evacuating fragments.  The bladder was emptied.  The patient was subsequently extubated and returned to the PACU in stable condition.  Disposition: The patient will be instructed to remove his stent on Monday morning, October 2.  Ardis Hughs, M.D.

## 2021-11-27 NOTE — Interval H&P Note (Signed)
History and Physical Interval Note:  11/27/2021 7:18 AM  Scott Hines  has presented today for surgery, with the diagnosis of RIGHT DISTAL URETERAL STONE/ELEVATED PSA.  The various methods of treatment have been discussed with the patient and family. After consideration of risks, benefits and other options for treatment, the patient has consented to  Procedure(s) with comments: RIGHT URETEROSCOPY/HOLMIUM LASER/STONE REMOVAL, RIGHT URETERAL STENT PLACEMENT (Right) - 90 MINUTES NEEDED TRANSRECTAL ULTRASOUND-PROSTATE BIOPSY (N/A) as a surgical intervention.  The patient's history has been reviewed, patient examined, no change in status, stable for surgery.  I have reviewed the patient's chart and labs.  Questions were answered to the patient's satisfaction.     Ardis Hughs

## 2021-11-27 NOTE — OR Nursing (Signed)
Patient requests to rest for a few more minutes before discharged to home.  11:15 patient states he feels better.  More alert and awake states he is ready to go home.  Skin warm dry pink resp even and unlabored.  Darin Engels rn

## 2021-11-27 NOTE — H&P (Signed)
61 year old male who presents today for follow-up of an elevated PSA. I saw him 6 weeks ago and at that time he was having some pelvic pain which she attributed to standing on a ladder for about a week painting. He was complaining of some soreness. He was having some dysuria and restricted stream. His prostate was tender to palpation. His PSA was 6.34.   We treated the patient with meloxicam and tamsulosin. His symptoms have now resolved. He has no voiding symptoms or pain. His PSA is trended down to 4.7. He is otherwise doing well without any significant problems. He is no longer taking the medication.   Interval: Today the patient is here for reevaluation. In the interval the patient has had 2 episodes of pain. The one episode was related to right kidney stones. He has CT scan confirming this. He had subsequently had an ultrasound that demonstrated persistent hydronephrosis. However, the patient is no longer having any kidney pain. He does not recall passing any of the stones. In addition the patient had an episode of prostatitis. He was prescribed tamsulosin again and meloxicam and his symptoms quickly resolved. 6 weeks later he had his PSA checked. He is here today for further discussion of that. The patient is voiding well without any difficulty. Denies any urgency or dysuria. He is no longer having any discomfort. He denies any hematuria.     ALLERGIES: No Allergies    MEDICATIONS: Tamsulosin Hcl 0.4 mg capsule 1 capsule PO Daily  Ibuprofen     GU PSH: No GU PSH    NON-GU PSH: No Non-GU PSH    GU PMH: Elevated PSA - 04/14/2021, - 01/27/2021 Acute prostatitis - 01/27/2021 Peyronies Disease, Peyronie's disease - 2014      PMH Notes:  1898-03-01 00:00:00 - Note: Normal Routine History And Physical Adult   NON-GU PMH: Hypercholesterolemia Hypertension    FAMILY HISTORY: Diverticulitis Of Colon - Mother Family Health Status - Father alive at age 32 - 88 In Family Family Health  Status - Mother's Age - Runs In Family Family Health Status Number - Runs In Family Heart Disease - Father Hypertension - Father Kidney Stones - Father nephrolithiasis - Father   SOCIAL HISTORY: Marital Status: Married Preferred Language: English; Ethnicity: Not Hispanic Or Latino; Race: White Current Smoking Status: Patient has never smoked.   Tobacco Use Assessment Completed: Used Tobacco in last 30 days? Drinks 2 drinks per day.  Drinks 2 caffeinated drinks per day.     Notes: Never Smoked, Caffeine Use, Marital History - Currently Married, Alcohol Use, Occupation:   REVIEW OF SYSTEMS:    GU Review Male:   Patient denies frequent urination, hard to postpone urination, burning/ pain with urination, get up at night to urinate, leakage of urine, stream starts and stops, trouble starting your stream, have to strain to urinate , erection problems, and penile pain.  Gastrointestinal (Upper):   Patient denies vomiting, indigestion/ heartburn, and nausea.  Gastrointestinal (Lower):   Patient denies diarrhea and constipation.  Constitutional:   Patient denies fever, night sweats, weight loss, and fatigue.  Skin:   Patient denies skin rash/ lesion and itching.  Eyes:   Patient denies blurred vision and double vision.  Ears/ Nose/ Throat:   Patient denies sore throat and sinus problems.  Hematologic/Lymphatic:   Patient denies swollen glands and easy bruising.  Cardiovascular:   Patient denies leg swelling and chest pains.  Respiratory:   Patient denies cough and shortness of breath.  Endocrine:  Patient denies excessive thirst.  Musculoskeletal:   Patient denies back pain and joint pain.  Neurological:   Patient denies headaches and dizziness.  Psychologic:   Patient denies depression and anxiety.   VITAL SIGNS:      10/13/2021 09:25 AM  BP 142/91 mmHg  Pulse 65 /min  Temperature 97.3 F / 36.2 C   Complexity of Data:   10/07/21 07/13/21 04/07/21  PSA  Total PSA 6.01 ng/mL 5.17  ng/mL 4.71 ng/mL    PROCEDURES:         Renal Ultrasound (Limited) - 71245  Patient confirmed No Neulasta OnPro Device. Right Kidney: Length: 10.46 cm Depth: 5.35cm Cortical Width: 0.85 cm Width: 5.61 cm    Right Kidney/Ureter:  1)? Minimal Fullness------2)0.50cm Lower Pole Cystic Area  Bladder:  PVR = 174.33m----? 0.62cm Right Distal Ureteral Stone-----Unable to Visualize either Ureteral Jet               Urinalysis w/Scope Dipstick Dipstick Cont'd Micro  Color: Yellow Bilirubin: Neg mg/dL WBC/hpf: 0 - 5/hpf  Appearance: Clear Ketones: Trace mg/dL RBC/hpf: 3 - 10/hpf  Specific Gravity: 1.020 Blood: Neg ery/uL Bacteria: Rare (0-9/hpf)  pH: 7.5 Protein: Neg mg/dL Cystals: NS (Not Seen)  Glucose: Neg mg/dL Urobilinogen: 1.0 mg/dL Casts: NS (Not Seen)    Nitrites: Neg Trichomonas: Not Present    Leukocyte Esterase: Trace leu/uL Mucous: Present      Epithelial Cells: 0 - 5/hpf      Yeast: NS (Not Seen)      Sperm: Not Present    Notes: qns to spin         Notes:   Patient given written and verbal prostate biopsy instructions. -AWL, CMA   ASSESSMENT:      ICD-10 Details  1 GU:   Elevated PSA - R97.20   2   Hydronephrosis - N13.0      PLAN:            Medications New Meds: Levofloxacin 750 mg tablet 1 tablet PO Daily Take on the morning of your procedure  #1  0 Refill(s)  Pharmacy Name:  [id number]:  [id number] [id number]:  [phone]:  [phone]          Orders X-Rays: Renal Ultrasound (Limited) - Right renal u/s          Schedule Return Visit/Planned Activity: ASAP - Consult, TRUSP          Document Letter(s):  Created for Patient: Clinical Summary         Notes:   Today I recommended that we obtain a renal ultrasound of his right kidney to make sure that his hydronephrosis has completely resolved at this point. We discussed the fact that we need confirmation that his kidney stones have  passed to minimize the risk of him having silent hydronephrosis and atrophy of the kidney.   In addition, we spoke about prostate biopsy given the kinetics of his PSA. At this time I think we need to rule out prostate cancer. I spoke to the patient about this. He agreed. We have offered to forego an MRI and proceed straight to the biopsy. I discussed the risk and associated benefits. We will plan to get this done as quickly as possible.   CC: Dr. RoGaynelle ArabianMD        Next Appointment:      Next Appointment: 12/08/2021 01:15 PM  Appointment Type: Prostate Ultrasound Biopsy    Location: [facility] [phone]    Provider: Radiology Rm1 Radiology Rm 1    Reason for Visit: ASAP - Consult, TRUSP         APPENDED NOTES:    The patient's renal ultrasound demonstrates persistent mild hydronephrosis of his right kidney with an echogenic focus in the area of the ureteral orifice on the right with some twinkle artifact suggesting that he may have a persistent stone. There is no ureteral jet visualized.   I called the patient left a message, I will contact him again with a recommendation of a stone protocol CT scan at his convenience.

## 2021-11-27 NOTE — Discharge Instructions (Addendum)
DISCHARGE INSTRUCTIONS FOR KIDNEY STONE/URETERAL STENT   MEDICATIONS:  1.  Resume all your other meds from home - except do not take any extra narcotic pain meds that you may have at home.  2. Tamsulosin is for ureteral stent/spasm and painful urination. 3. Tramadol is for moderate/severe pain, otherwise taking upto 1000 mg every 6 hours of plainTylenol will help treat your pain.   4. Take Cipro one hour prior to removal of your stent.   ACTIVITY:  1. No strenuous activity x 1week  2. No driving while on narcotic pain medications  3. Drink plenty of water  4. Continue to walk at home - you can still get blood clots when you are at home, so keep active, but don't over do it.  5. May return to work/school tomorrow or when you feel ready   BATHING:  1. You can shower and we recommend daily showers  2. You have a string coming from your urethra: The stent string is attached to your ureteral stent. Do not pull on this.   SIGNS/SYMPTOMS TO CALL:  Please call us if you have a fever greater than 101.5, uncontrolled nausea/vomiting, uncontrolled pain, dizziness, unable to urinate, bloody urine, chest pain, shortness of breath, leg swelling, leg pain, redness around wound, drainage from wound, or any other concerns or questions.   You can reach [place] at [phone].   FOLLOW-UP:  1. You have an appointment w/ Dr. Louis Meckel on 10/17. 2. Remove the stent on Monday, Oct 2nd.  To do this, gently pull the string until the stent is all the way out and then throw it away.  You may feel a funny sensation in your back, but this typically is not a painful process.  Call urology office if you have questions or concerns.  For several days the patient:  should increase his fluid intake and limit strenuous activity. he might have mild discomfort at the base of his penis or in his rectum. he might have blood in his urine or blood in his bowel movements.  For 2-3 months he might have blood in his ejaculate  (semen).  Call the office immedicately:  for blood clots in the urine or bowel movements,  difficulty urinating,  inability to urinate,  urinary retention,  painful or frequent urination,  fever, chills,  nausea, vomiting, other illness.    The prostate biopsy pathology reports are usually available within 3-5 working days, unless a pathologic second opinion is required, which may take 7-14 days.   [facility] to check on the status of his biopsy if he has not heard from [place] within 7 days.  [facility]:  [phone]    Post Anesthesia Home Care Instructions  Activity: Get plenty of rest for the remainder of the day. A responsible individual must stay with you for 24 hours following the procedure.  For the next 24 hours, DO NOT: -Drive a car -Paediatric nurse -Drink alcoholic beverages -Take any medication unless instructed by your physician -Make any legal decisions or sign important papers.  Meals: Start with liquid foods such as gelatin or soup. Progress to regular foods as tolerated. Avoid greasy, spicy, heavy foods. If nausea and/or vomiting occur, drink only clear liquids until the nausea and/or vomiting subsides. Call your physician if vomiting continues.  Special Instructions/Symptoms: Your throat may feel dry or sore from the anesthesia or the breathing tube placed in your throat during surgery. If this causes discomfort, gargle with warm salt water. The discomfort should  disappear within 24 hours.  [facility] [phone] Post Ureteroscopy With Stent Instructions  Definitions:  Ureter: The duct that transports urine from the kidney to the bladder. Stent:   A plastic hollow tube that is placed into the ureter, from the kidney to the bladder to prevent the ureter from swelling shut.  GENERAL INSTRUCTIONS:  Despite the fact that no skin incisions were used, the area around the ureter and bladder is raw and irritated. The  stent is a foreign body which will further irritate the bladder wall. This irritation is manifested by increased frequency of urination, both day and night, and by an increase in the urge to urinate. In some, the urge to urinate is present almost always. Sometimes the urge is strong enough that you may not be able to stop yourself from urinating. The only real cure is to remove the stent and then give time for the bladder wall to heal which can't be done until the danger of the ureter swelling shut has passed, which varies.  You may see some blood in your urine while the stent is in place and a few days afterwards. Do not be alarmed, even if the urine was clear for a while. Get off your feet and drink lots of fluids until clearing occurs. If you start to pass clots or don't improve, call us.  DIET: You may return to your normal diet immediately. Because of the raw surface of your bladder, alcohol, spicy foods, acid type foods and drinks with caffeine may cause irritation or frequency and should be used in moderation. To keep your urine flowing freely and to avoid constipation, drink plenty of fluids during the day ( 8-10 glasses ). Tip: Avoid cranberry juice because it is very acidic.  ACTIVITY: Your physical activity doesn't need to be restricted. However, if you are very active, you may see some blood in your urine. We suggest that you reduce your activity under these circumstances until the bleeding has stopped.  BOWELS: It is important to keep your bowels regular during the postoperative period. Straining with bowel movements can cause bleeding. A bowel movement every other day is reasonable. Use a mild laxative if needed, such as Milk of Magnesia 2-3 tablespoons, or 2 Dulcolax tablets. Call if you continue to have problems. If you have been taking narcotics for pain, before, during or after your surgery, you may be constipated. Take a laxative if necessary.   MEDICATION: You should resume your  pre-surgery medications unless told not to. In addition you will often be given an antibiotic to prevent infection. These should be taken as prescribed until the bottles are finished unless you are having an unusual reaction to one of the drugs.  PROBLEMS YOU SHOULD REPORT TO [place]: Fevers over 100.5 Fahrenheit. Heavy bleeding, or clots ( See above notes about blood in urine ). Inability to urinate. Drug reactions ( hives, rash, nausea, vomiting, diarrhea ). Severe burning or pain with urination that is not improving.  FOLLOW-UP: You will need a follow-up appointment to monitor your progress. Call for this appointment at the number listed above. Usually the first appointment will be about three to fourteen days after your surgery.  May take Tyelnol as needed for soeness/discomfort beginning at 96 Noon.

## 2021-11-27 NOTE — Anesthesia Procedure Notes (Signed)
Procedure Name: LMA Insertion Date/Time: 11/27/2021 7:38 AM  Performed by: Clearnce Sorrel, CRNAPre-anesthesia Checklist: Patient identified, Emergency Drugs available, Suction available and Patient being monitored Patient Re-evaluated:Patient Re-evaluated prior to induction Oxygen Delivery Method: Circle System Utilized Preoxygenation: Pre-oxygenation with 100% oxygen Induction Type: IV induction Ventilation: Mask ventilation without difficulty LMA: LMA inserted LMA Size: 4.0 Number of attempts: 1 Airway Equipment and Method: Bite block Placement Confirmation: positive ETCO2 Tube secured with: Tape Dental Injury: Teeth and Oropharynx as per pre-operative assessment

## 2021-11-30 ENCOUNTER — Encounter (HOSPITAL_BASED_OUTPATIENT_CLINIC_OR_DEPARTMENT_OTHER): Payer: Self-pay | Admitting: Urology

## 2021-11-30 LAB — SURGICAL PATHOLOGY

## 2021-12-03 LAB — CALCULI, WITH PHOTOGRAPH (CLINICAL LAB)
Calcium Oxalate Dihydrate: 100 %
Weight Calculi: 52 mg

## 2022-01-13 DIAGNOSIS — E7800 Pure hypercholesterolemia, unspecified: Secondary | ICD-10-CM | POA: Diagnosis not present

## 2022-02-09 DIAGNOSIS — L821 Other seborrheic keratosis: Secondary | ICD-10-CM | POA: Diagnosis not present

## 2022-02-09 DIAGNOSIS — D2371 Other benign neoplasm of skin of right lower limb, including hip: Secondary | ICD-10-CM | POA: Diagnosis not present

## 2022-02-09 DIAGNOSIS — L814 Other melanin hyperpigmentation: Secondary | ICD-10-CM | POA: Diagnosis not present

## 2022-02-09 DIAGNOSIS — Z872 Personal history of diseases of the skin and subcutaneous tissue: Secondary | ICD-10-CM | POA: Diagnosis not present

## 2022-03-26 DIAGNOSIS — C61 Malignant neoplasm of prostate: Secondary | ICD-10-CM | POA: Diagnosis not present

## 2022-04-07 ENCOUNTER — Other Ambulatory Visit: Payer: Self-pay | Admitting: Urology

## 2022-05-04 IMAGING — US US RENAL
1 series · 14 of 25 positions shown · non-contrast
Comparison: CT scan of the abdomen and pelvis June 11, 2021

CLINICAL DATA: Evaluate right-sided hydronephrosis identified on a
recent CT scan.

EXAM:
RENAL / URINARY TRACT ULTRASOUND COMPLETE

[Series 1: us renal · 0.23mm/px · 14 of 44 slices shown]
[im 1/44]
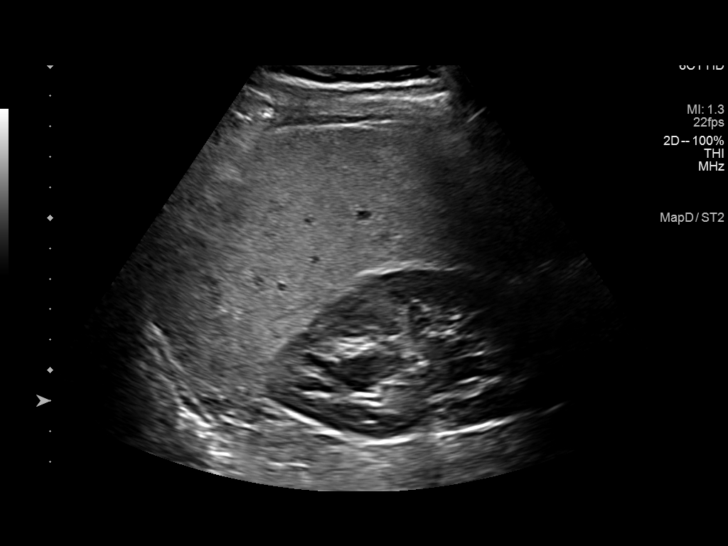
[im 4/44]
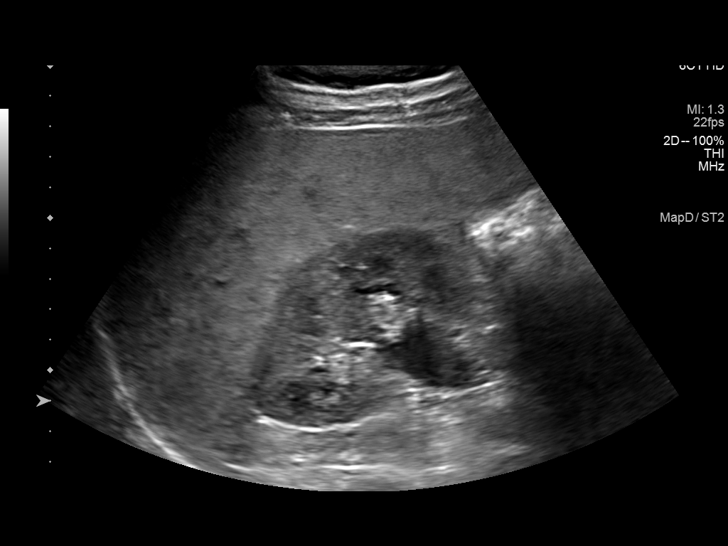
[im 8/44]
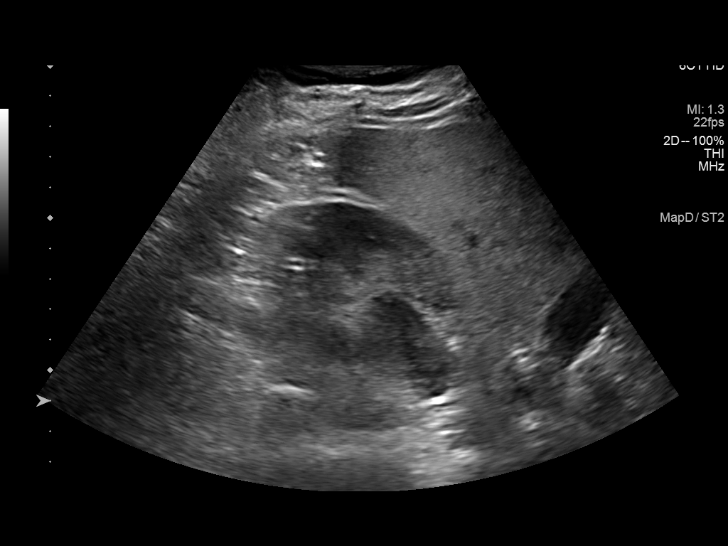
[im 11/44]
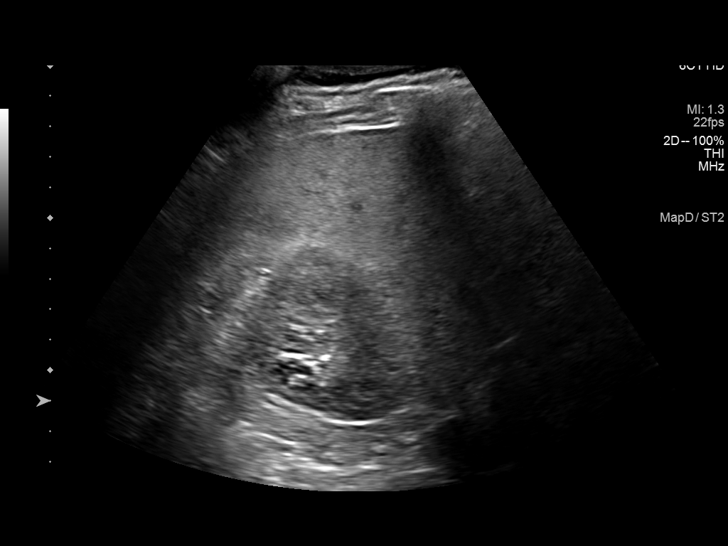
[im 15/44]
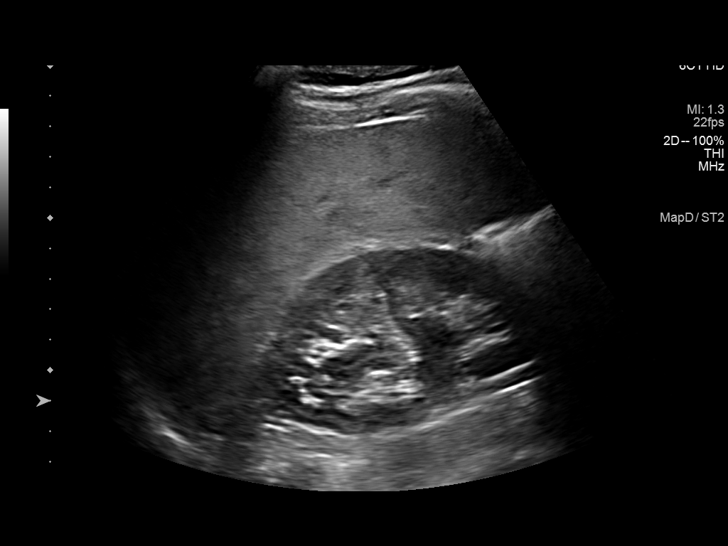
[im 17/44]
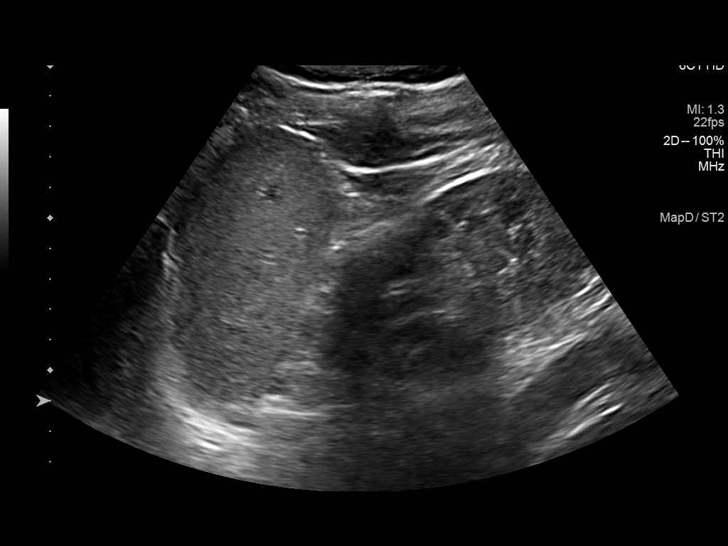
[im 20/44]
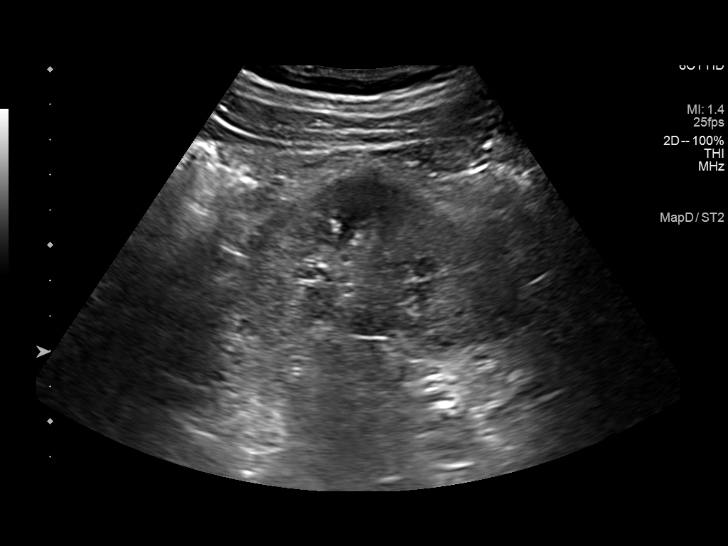
[im 24/44]
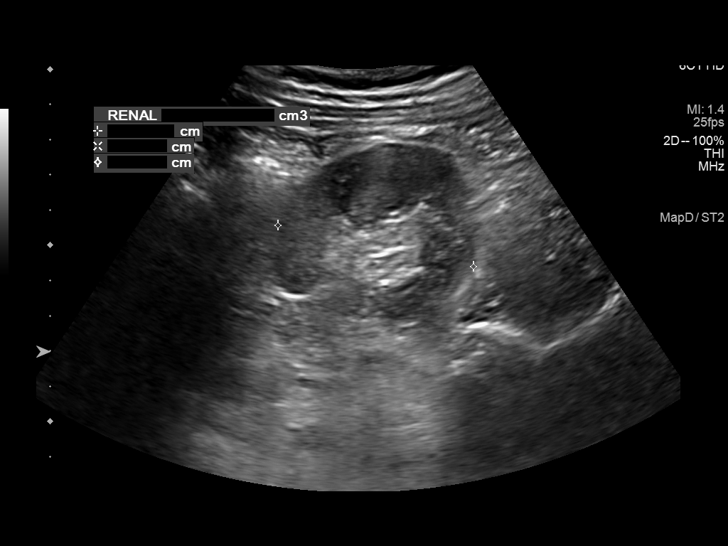
[im 27/44]
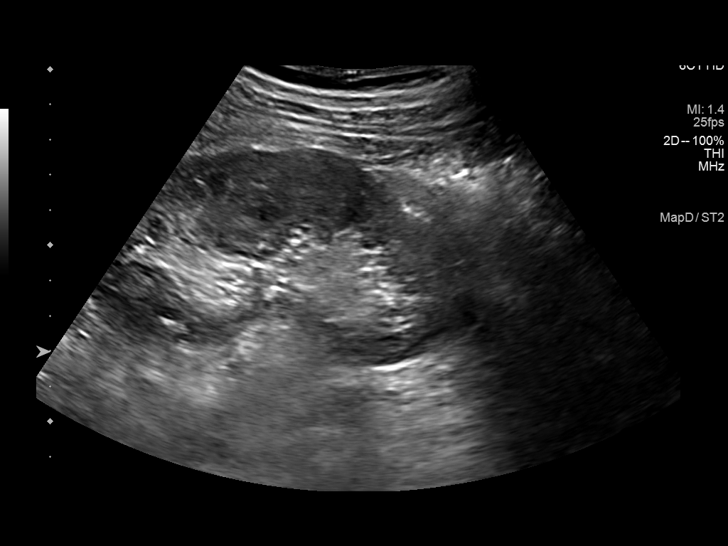
[im 29/44]
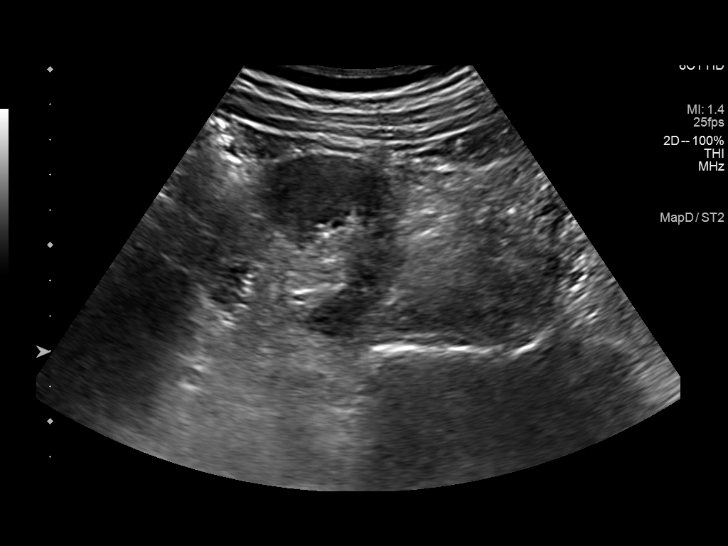
[im 33/44]
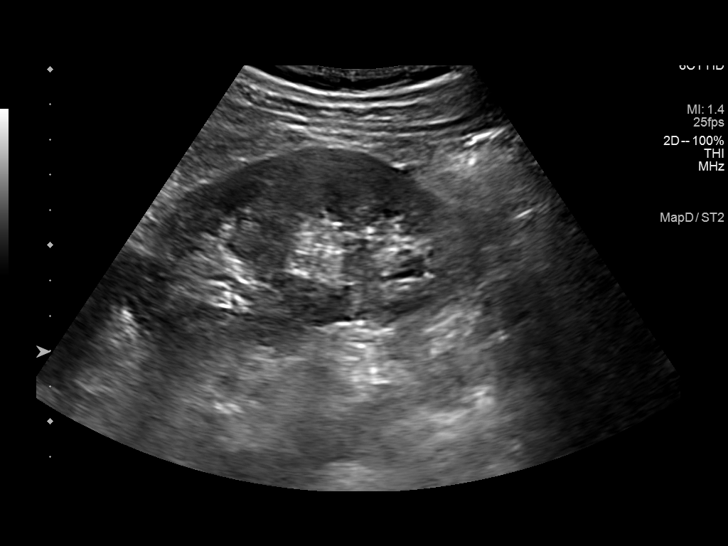
[im 36/44]
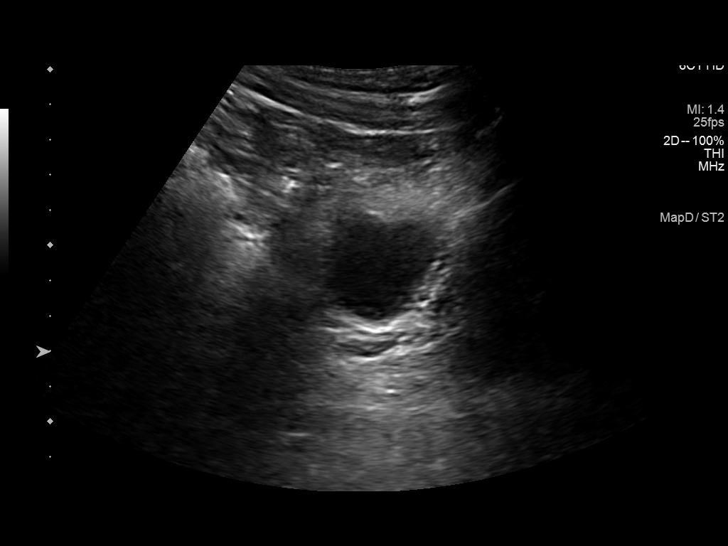
[im 40/44]
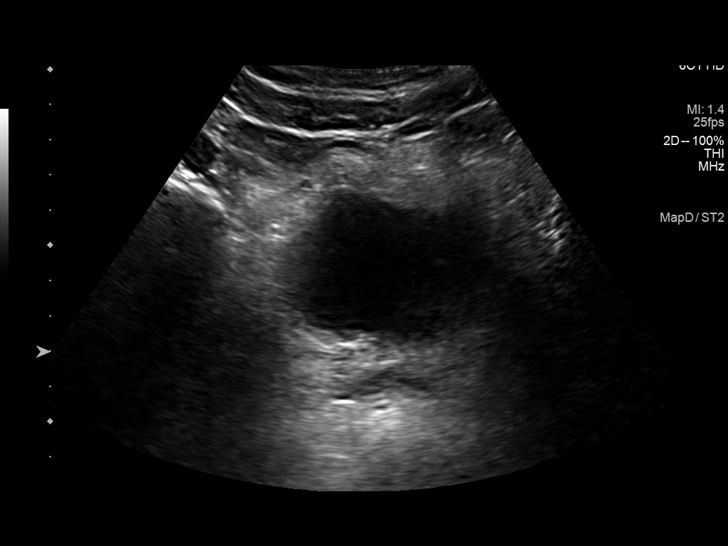
[im 44/44]
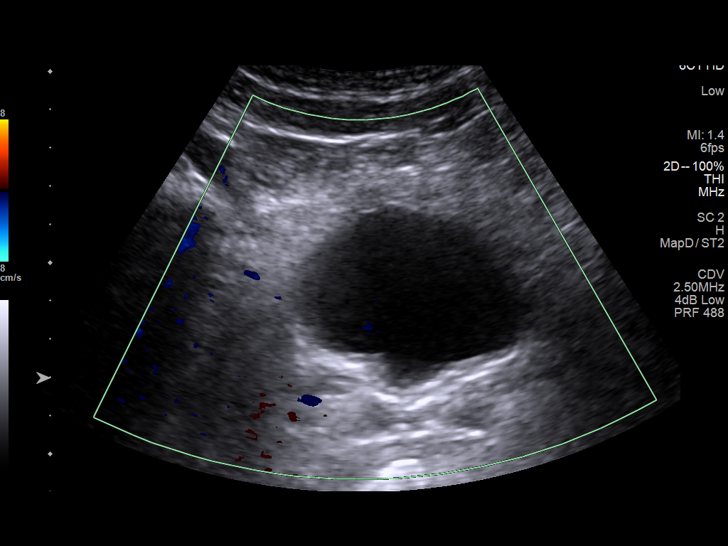

[14 of 25 positions shown; findings below may reference images not displayed]

FINDINGS: Right Kidney:

Renal measurements: 10.6 x 5.3 x 5.4 cm = volume: 158 mL. Mild
hydronephrosis remains on the right.

Left Kidney:

Renal measurements: 11.3 x 5.1 x 5.7 cm = volume: 171 mL.
Echogenicity within normal limits. No mass or hydronephrosis
visualized.

Bladder:

Appears normal for degree of bladder distention.

Other:

None.
IMPRESSION: 1. Persistent mild hydronephrosis on the right. No other
abnormalities.

## 2022-05-19 DIAGNOSIS — M6281 Muscle weakness (generalized): Secondary | ICD-10-CM | POA: Diagnosis not present

## 2022-05-19 DIAGNOSIS — C61 Malignant neoplasm of prostate: Secondary | ICD-10-CM | POA: Diagnosis not present

## 2022-06-01 DIAGNOSIS — C61 Malignant neoplasm of prostate: Secondary | ICD-10-CM | POA: Diagnosis not present

## 2022-06-08 DIAGNOSIS — M62838 Other muscle spasm: Secondary | ICD-10-CM | POA: Diagnosis not present

## 2022-06-08 DIAGNOSIS — M6281 Muscle weakness (generalized): Secondary | ICD-10-CM | POA: Diagnosis not present

## 2022-06-08 DIAGNOSIS — N393 Stress incontinence (female) (male): Secondary | ICD-10-CM | POA: Diagnosis not present

## 2022-06-14 NOTE — Patient Instructions (Signed)
DUE TO COVID-19 ONLY TWO VISITORS  (aged 60 and older)  ARE ALLOWED TO COME WITH YOU AND STAY IN THE WAITING ROOM ONLY DURING PRE OP AND PROCEDURE.   **NO VISITORS ARE ALLOWED IN THE SHORT STAY AREA OR RECOVERY ROOM!!**  IF YOU WILL BE ADMITTED INTO THE HOSPITAL YOU ARE ALLOWED ONLY FOUR SUPPORT PEOPLE DURING VISITATION HOURS ONLY (7 AM -8PM)   The support person(s) must pass our screening, gel in and out, and wear a mask at all times, including in the patient's room. Patients must also wear a mask when staff or their support person are in the room. Visitors GUEST BADGE MUST BE WORN VISIBLY  One adult visitor may remain with you overnight and MUST be in the room by 8 P.M.     Your procedure is scheduled on: 06/24/22   Report to [facility] Main Entrance    Report to admitting at A: 5:15 M   Call this number if you have problems the morning of surgery [phone]   Clear liquids starting the day before surgery until : 5:15 AM DAY OF SURGERY. Drink plenty clear liquids the day of the prep.  Water Black Coffee (sugar ok, NO MILK/CREAM OR CREAMERS)  Tea (sugar ok, NO MILK/CREAM OR CREAMERS) regular and decaf                             Plain Jell-O (NO RED)                                           Fruit ices (not with fruit pulp, NO RED)                                     Popsicles (NO RED)                                                                  Juice: apple, WHITE grape, WHITE cranberry Sports drinks like Gatorade (NO RED)              FOLLOW BOWEL PREP AND ANY ADDITIONAL PRE OP INSTRUCTIONS YOU RECEIVED FROM YOUR SURGEON'S OFFICE!!!   Oral Hygiene is also important to reduce your risk of infection.                                    Remember - BRUSH YOUR TEETH THE MORNING OF SURGERY WITH YOUR REGULAR TOOTHPASTE  DENTURES WILL BE REMOVED PRIOR TO SURGERY PLEASE DO NOT APPLY "Poly grip" OR ADHESIVES!!!   Do NOT smoke after Midnight   Take these medicines the  morning of surgery with A SIP OF WATER: Tramadol as needed.                              You may not have any metal on your body including hair pins, jewelry, and body piercing  Do not wear lotions, powders, perfumes/cologne, or deodorant              Men may shave face and neck.   Do not bring valuables to the hospital. [place].   Contacts, glasses, or bridgework may not be worn into surgery.   Bring small overnight bag day of surgery.   DO NOT [place]. PHARMACY WILL DISPENSE MEDICATIONS LISTED ON YOUR MEDICATION LIST TO YOU DURING YOUR ADMISSION [place]!    Patients discharged on the day of surgery will not be allowed to drive home.  Someone NEEDS to stay with you for the first 24 hours after anesthesia.   Special Instructions: Bring a copy of your healthcare power of attorney and living will documents         the day of surgery if you haven't scanned them before.              Please read over the following fact sheets you were given: IF YOU HAVE QUESTIONS ABOUT YOUR PRE-OP INSTRUCTIONS PLEASE CALL [phone]    [facility] - Preparing for Surgery Before surgery, you can play an important role.  Because skin is not sterile, your skin needs to be as free of germs as possible.  You can reduce the number of germs on your skin by washing with CHG (chlorahexidine gluconate) soap before surgery.  CHG is an antiseptic cleaner which kills germs and bonds with the skin to continue killing germs even after washing. Please DO NOT use if you have an allergy to CHG or antibacterial soaps.  If your skin becomes reddened/irritated stop using the CHG and inform your nurse when you arrive at Short Stay. Do not shave (including legs and underarms) for at least 48 hours prior to the first CHG shower.  You may shave your face/neck. Please follow these instructions carefully:  1.  Shower with CHG  Soap the night before surgery and the  morning of Surgery.  2.  If you choose to wash your hair, wash your hair first as usual with your  normal  shampoo.  3.  After you shampoo, rinse your hair and body thoroughly to remove the  shampoo.                           4.  Use CHG as you would any other liquid soap.  You can apply chg directly  to the skin and wash                       Gently with a scrungie or clean washcloth.  5.  Apply the CHG Soap to your body ONLY FROM THE NECK DOWN.   Do not use on face/ open                           Wound or open sores. Avoid contact with eyes, ears mouth and genitals (private parts).                       Wash face,  Genitals (private parts) with your normal soap.             6.  Wash thoroughly, paying special attention to the area where your surgery  will be performed.  7.  Thoroughly rinse your body with warm water from the neck down.  8.  DO NOT shower/wash with your normal soap after using and rinsing off  the CHG Soap.                9.  Pat yourself dry with a clean towel.            10.  Wear clean pajamas.            11.  Place clean sheets on your bed the night of your first shower and do not  sleep with pets. Day of Surgery : Do not apply any lotions/deodorants the morning of surgery.  Please wear clean clothes to the hospital/surgery center.  FAILURE TO FOLLOW THESE INSTRUCTIONS MAY RESULT IN THE CANCELLATION OF YOUR SURGERY PATIENT SIGNATURE_________________________________  NURSE SIGNATURE__________________________________  ________________________________________________________________________

## 2022-06-15 ENCOUNTER — Encounter (HOSPITAL_COMMUNITY)
Admission: RE | Admit: 2022-06-15 | Discharge: 2022-06-15 | Disposition: A | Discharge status: Home / Self Care | Payer: 59 | Source: Ambulatory Visit | Attending: Urology | Admitting: Urology

## 2022-06-15 ENCOUNTER — Other Ambulatory Visit: Payer: Self-pay

## 2022-06-15 ENCOUNTER — Encounter (HOSPITAL_COMMUNITY): Payer: Self-pay

## 2022-06-15 DIAGNOSIS — Z01812 Encounter for preprocedural laboratory examination: Secondary | ICD-10-CM | POA: Diagnosis not present

## 2022-06-15 HISTORY — DX: Depression, unspecified: F32.A

## 2022-06-15 HISTORY — DX: Malignant (primary) neoplasm, unspecified: C80.1

## 2022-06-15 LAB — COMPREHENSIVE METABOLIC PANEL
ALT: 32 U/L (ref 0–44)
AST: 29 U/L (ref 15–41)
Albumin: 4.1 g/dL (ref 3.5–5.0)
Alkaline Phosphatase: 44 U/L (ref 38–126)
Anion gap: 5 (ref 5–15)
BUN: 14 mg/dL (ref 6–20)
CO2: 25 mmol/L (ref 22–32)
Calcium: 8.9 mg/dL (ref 8.9–10.3)
Chloride: 105 mmol/L (ref 98–111)
Creatinine, Ser: 0.75 mg/dL (ref 0.61–1.24)
GFR, Estimated: >60 mL/min (ref >60)
Glucose, Bld: 102 mg/dL — ABNORMAL HIGH (ref 70–99)
Potassium: 3.9 mmol/L (ref 3.5–5.1)
Sodium: 135 mmol/L (ref 135–145)
Total Bilirubin: 1.4 mg/dL — ABNORMAL HIGH (ref 0.3–1.2)
Total Protein: 6.8 g/dL (ref 6.5–8.1)

## 2022-06-15 LAB — CBC
HCT: 45.2 % (ref 39.0–52.0)
Hemoglobin: 15.3 g/dL (ref 13.0–17.0)
MCH: 32.2 pg (ref 26.0–34.0)
MCHC: 33.8 g/dL (ref 30.0–36.0)
MCV: 95.2 fL (ref 80.0–100.0)
Platelets: 284 10*3/uL (ref 150–400)
RBC: 4.75 MIL/uL (ref 4.22–5.81)
RDW: 11.9 % (ref 11.5–15.5)
WBC: 4.3 10*3/uL (ref 4.0–10.5)
nRBC: 0 % (ref 0.0–0.2)

## 2022-06-15 NOTE — Progress Notes (Signed)
For Short Stay: COVID SWAB appointment date:  Bowel Prep reminder: Reviewed   For Anesthesia: PCP - Dr. Irven Coe Cardiologist - N/A  Chest x-ray -  EKG -  Stress Test -  ECHO -  Cardiac Cath -  Pacemaker/ICD device last checked: Pacemaker orders received: Device Rep notified:  Spinal Cord Stimulator:N/A  Sleep Study - N/A CPAP -   Fasting Blood Sugar - N/A Checks Blood Sugar _____ times a day Date and result of last Hgb A1c-  Last dose of GLP1 agonist-  GLP1 instructions:   Last dose of SGLT-2 inhibitors-  SGLT-2 instructions:   Blood Thinner Instructions: Aspirin Instructions: Last Dose:  Activity level: Can go up a flight of stairs and activities of daily living without stopping and without chest pain and/or shortness of breath   Able to exercise without chest pain and/or shortness of breath  Anesthesia review:   Patient denies shortness of breath, fever, cough and chest pain at PAT appointment   Patient verbalized understanding of instructions that were given to them at the PAT appointment. Patient was also instructed that they will need to review over the PAT instructions again at home before surgery.

## 2022-06-16 LAB — URINE CULTURE: Culture: NO GROWTH

## 2022-06-23 NOTE — Anesthesia Preprocedure Evaluation (Addendum)
Anesthesia Evaluation  Patient identified by MRN, date of birth, ID band Patient awake    Reviewed: Allergy & Precautions, H&P , NPO status , Patient's Chart, lab work & pertinent test results  Airway Mallampati: II  TM Distance: >3 FB Neck ROM: Full    Dental no notable dental hx. (+) Teeth Intact, Dental Advisory Given, Caps   Pulmonary neg pulmonary ROS   Pulmonary exam normal        Cardiovascular negative cardio ROS Normal cardiovascular exam     Neuro/Psych negative neurological ROS  negative psych ROS   GI/Hepatic negative GI ROS, Neg liver ROS,,,  Endo/Other  negative endocrine ROS    Renal/GU negative Renal ROS  negative genitourinary   Musculoskeletal negative musculoskeletal ROS (+)    Abdominal   Peds negative pediatric ROS (+)  Hematology negative hematology ROS (+)   Anesthesia Other Findings RIGHT DISTAL URETERAL STONE/ELEVATED PSA  Reproductive/Obstetrics negative OB ROS                             Anesthesia Physical Anesthesia Plan  ASA: 2  Anesthesia Plan: General   Post-op Pain Management: Celebrex PO (pre-op)*, Tylenol PO (pre-op)* and Dilaudid IV   Induction: Intravenous  PONV Risk Score and Plan: 2 and Ondansetron, Dexamethasone, Midazolam and Treatment may vary due to age or medical condition  Airway Management Planned: Oral ETT  Additional Equipment: None  Intra-op Plan:   Post-operative Plan: Extubation in OR  Informed Consent: I have reviewed the patients History and Physical, chart, labs and discussed the procedure including the risks, benefits and alternatives for the proposed anesthesia with the patient or authorized representative who has indicated his/her understanding and acceptance.     Dental advisory given  Plan Discussed with: CRNA and Anesthesiologist  Anesthesia Plan Comments: (2 large IV's)        Anesthesia Quick  Evaluation

## 2022-06-24 ENCOUNTER — Other Ambulatory Visit: Payer: Self-pay

## 2022-06-24 ENCOUNTER — Encounter (HOSPITAL_COMMUNITY): Payer: Self-pay | Admitting: Urology

## 2022-06-24 ENCOUNTER — Encounter (HOSPITAL_COMMUNITY)
Admission: RE | Disposition: A | Discharge status: Home / Self Care | Payer: Self-pay | Source: Ambulatory Visit | Attending: Urology

## 2022-06-24 ENCOUNTER — Observation Stay (HOSPITAL_COMMUNITY)
Admission: RE | Admit: 2022-06-24 | Discharge: 2022-06-25 | Disposition: A | Discharge status: Home / Self Care | Payer: 59 | Source: Ambulatory Visit | Attending: Urology | Admitting: Urology

## 2022-06-24 ENCOUNTER — Ambulatory Visit (HOSPITAL_BASED_OUTPATIENT_CLINIC_OR_DEPARTMENT_OTHER): Payer: 59 | Admitting: Anesthesiology

## 2022-06-24 ENCOUNTER — Ambulatory Visit (HOSPITAL_COMMUNITY): Payer: 59 | Admitting: Anesthesiology

## 2022-06-24 DIAGNOSIS — C61 Malignant neoplasm of prostate: Principal | ICD-10-CM | POA: Insufficient documentation

## 2022-06-24 DIAGNOSIS — I10 Essential (primary) hypertension: Secondary | ICD-10-CM | POA: Diagnosis not present

## 2022-06-24 HISTORY — PX: ROBOT ASSISTED LAPAROSCOPIC RADICAL PROSTATECTOMY: SHX5141

## 2022-06-24 LAB — HEMOGLOBIN AND HEMATOCRIT, BLOOD
HCT: 42.5 % (ref 39.0–52.0)
Hemoglobin: 14.1 g/dL (ref 13.0–17.0)

## 2022-06-24 SURGERY — XI ROBOTIC ASSISTED LAPAROSCOPIC RADICAL PROSTATECTOMY LEVEL 1
Anesthesia: General

## 2022-06-24 MED ORDER — HYDROMORPHONE HCL 2 MG/ML IJ SOLN
INTRAMUSCULAR | Status: AC
  Filled 2022-06-24: qty 1

## 2022-06-24 MED ORDER — LACTATED RINGERS IV SOLN: INTRAVENOUS | Status: DC

## 2022-06-24 MED ORDER — ROCURONIUM BROMIDE 10 MG/ML (PF) SYRINGE
PREFILLED_SYRINGE | INTRAVENOUS | Status: DC | PRN
  Administered 2022-06-24 (×2): 30 mg via INTRAVENOUS
  Administered 2022-06-24: 70 mg via INTRAVENOUS
  Administered 2022-06-24: 20 mg via INTRAVENOUS
  Administered 2022-06-24: 10 mg via INTRAVENOUS

## 2022-06-24 MED ORDER — MIDAZOLAM HCL 5 MG/5ML IJ SOLN
INTRAMUSCULAR | Status: DC | PRN
  Administered 2022-06-24: 2 mg via INTRAVENOUS

## 2022-06-24 MED ORDER — HYDROMORPHONE HCL 1 MG/ML IJ SOLN
0.5000 mg | INTRAMUSCULAR | Status: DC | PRN
  Administered 2022-06-24: 1 mg via INTRAVENOUS
  Filled 2022-06-24 (×2): qty 1

## 2022-06-24 MED ORDER — DEXAMETHASONE SODIUM PHOSPHATE 10 MG/ML IJ SOLN
INTRAMUSCULAR | Status: AC
  Filled 2022-06-24: qty 1

## 2022-06-24 MED ORDER — TRIPLE ANTIBIOTIC 3.5-400-5000 EX OINT: 1.0000 | TOPICAL_OINTMENT | Freq: Three times a day (TID) | CUTANEOUS | Status: DC | PRN

## 2022-06-24 MED ORDER — BUPIVACAINE LIPOSOME 1.3 % IJ SUSP
INTRAMUSCULAR | Status: AC
  Filled 2022-06-24: qty 20

## 2022-06-24 MED ORDER — BUPIVACAINE HCL (PF) 0.5 % IJ SOLN
INTRAMUSCULAR | Status: DC | PRN
  Administered 2022-06-24: 30 mL

## 2022-06-24 MED ORDER — LIDOCAINE 2% (20 MG/ML) 5 ML SYRINGE
INTRAMUSCULAR | Status: DC | PRN
  Administered 2022-06-24: 60 mg via INTRAVENOUS

## 2022-06-24 MED ORDER — DEXAMETHASONE SODIUM PHOSPHATE 10 MG/ML IJ SOLN
INTRAMUSCULAR | Status: DC | PRN
  Administered 2022-06-24: 10 mg via INTRAVENOUS

## 2022-06-24 MED ORDER — SODIUM CHLORIDE 0.45 % IV SOLN: INTRAVENOUS | Status: DC

## 2022-06-24 MED ORDER — ACETAMINOPHEN 10 MG/ML IV SOLN: 1000.0000 mg | Freq: Four times a day (QID) | INTRAVENOUS | Status: DC

## 2022-06-24 MED ORDER — STERILE WATER FOR IRRIGATION IR SOLN
Status: DC | PRN
  Administered 2022-06-24: 1000 mL

## 2022-06-24 MED ORDER — BUPIVACAINE LIPOSOME 1.3 % IJ SUSP
INTRAMUSCULAR | Status: DC | PRN
  Administered 2022-06-24: 20 mL

## 2022-06-24 MED ORDER — HYOSCYAMINE SULFATE 0.125 MG SL SUBL: 0.1250 mg | SUBLINGUAL_TABLET | SUBLINGUAL | Status: DC | PRN

## 2022-06-24 MED ORDER — PROPOFOL 10 MG/ML IV BOLUS
INTRAVENOUS | Status: DC | PRN
  Administered 2022-06-24: 30 mg via INTRAVENOUS
  Administered 2022-06-24: 170 mg via INTRAVENOUS

## 2022-06-24 MED ORDER — HYDRALAZINE HCL 20 MG/ML IJ SOLN
INTRAMUSCULAR | Status: DC | PRN
  Administered 2022-06-24 (×2): 2.5 mg via INTRAVENOUS

## 2022-06-24 MED ORDER — FENTANYL CITRATE (PF) 100 MCG/2ML IJ SOLN
INTRAMUSCULAR | Status: AC
  Filled 2022-06-24: qty 2

## 2022-06-24 MED ORDER — LIDOCAINE HCL (PF) 2 % IJ SOLN
INTRAMUSCULAR | Status: AC
  Filled 2022-06-24: qty 5

## 2022-06-24 MED ORDER — CHLORHEXIDINE GLUCONATE 0.12 % MT SOLN
15.0000 mL | Freq: Once | OROMUCOSAL | Status: AC
  Administered 2022-06-24: 15 mL via OROMUCOSAL

## 2022-06-24 MED ORDER — MAGNESIUM CITRATE PO SOLN: 1.0000 | Freq: Once | ORAL | Status: DC

## 2022-06-24 MED ORDER — MIDAZOLAM HCL 2 MG/2ML IJ SOLN
INTRAMUSCULAR | Status: AC
  Filled 2022-06-24: qty 2

## 2022-06-24 MED ORDER — ROCURONIUM BROMIDE 10 MG/ML (PF) SYRINGE
PREFILLED_SYRINGE | INTRAVENOUS | Status: AC
  Filled 2022-06-24: qty 10

## 2022-06-24 MED ORDER — TRAMADOL HCL 50 MG PO TABS: 50.0000 mg | ORAL_TABLET | Freq: Four times a day (QID) | ORAL | 0 refills | Status: AC | PRN

## 2022-06-24 MED ORDER — LACTATED RINGERS IV SOLN: INTRAVENOUS | Status: DC | PRN

## 2022-06-24 MED ORDER — DIPHENHYDRAMINE HCL 50 MG/ML IJ SOLN: 12.5000 mg | Freq: Four times a day (QID) | INTRAMUSCULAR | Status: DC | PRN

## 2022-06-24 MED ORDER — ACETAMINOPHEN 10 MG/ML IV SOLN
1000.0000 mg | Freq: Four times a day (QID) | INTRAVENOUS | Status: DC
  Administered 2022-06-25: 1000 mg via INTRAVENOUS
  Filled 2022-06-24 (×2): qty 100

## 2022-06-24 MED ORDER — KETOROLAC TROMETHAMINE 15 MG/ML IJ SOLN
15.0000 mg | Freq: Four times a day (QID) | INTRAMUSCULAR | Status: DC
  Administered 2022-06-24 – 2022-06-25 (×3): 15 mg via INTRAVENOUS
  Filled 2022-06-24 (×3): qty 1

## 2022-06-24 MED ORDER — FENTANYL CITRATE (PF) 250 MCG/5ML IJ SOLN
INTRAMUSCULAR | Status: AC
  Filled 2022-06-24: qty 5

## 2022-06-24 MED ORDER — FENTANYL CITRATE (PF) 250 MCG/5ML IJ SOLN
INTRAMUSCULAR | Status: DC | PRN
  Administered 2022-06-24 (×2): 50 ug via INTRAVENOUS
  Administered 2022-06-24: 100 ug via INTRAVENOUS
  Administered 2022-06-24 (×3): 50 ug via INTRAVENOUS

## 2022-06-24 MED ORDER — DIPHENHYDRAMINE HCL 12.5 MG/5ML PO ELIX: 12.5000 mg | ORAL_SOLUTION | Freq: Four times a day (QID) | ORAL | Status: DC | PRN

## 2022-06-24 MED ORDER — DOCUSATE SODIUM 100 MG PO CAPS
100.0000 mg | ORAL_CAPSULE | Freq: Two times a day (BID) | ORAL | Status: DC
  Administered 2022-06-24: 100 mg via ORAL
  Filled 2022-06-24: qty 1

## 2022-06-24 MED ORDER — ORAL CARE MOUTH RINSE: 15.0000 mL | Freq: Once | OROMUCOSAL | Status: AC

## 2022-06-24 MED ORDER — ONDANSETRON HCL 4 MG/2ML IJ SOLN
INTRAMUSCULAR | Status: AC
  Filled 2022-06-24: qty 2

## 2022-06-24 MED ORDER — BUPIVACAINE HCL (PF) 0.5 % IJ SOLN
INTRAMUSCULAR | Status: AC
  Filled 2022-06-24: qty 30

## 2022-06-24 MED ORDER — ACETAMINOPHEN 500 MG PO TABS
1000.0000 mg | ORAL_TABLET | Freq: Four times a day (QID) | ORAL | Status: DC
  Administered 2022-06-24: 1000 mg via ORAL
  Filled 2022-06-24 (×2): qty 2

## 2022-06-24 MED ORDER — ONDANSETRON HCL 4 MG/2ML IJ SOLN
4.0000 mg | INTRAMUSCULAR | Status: DC | PRN
  Administered 2022-06-24: 4 mg via INTRAVENOUS
  Filled 2022-06-24: qty 2

## 2022-06-24 MED ORDER — SULFAMETHOXAZOLE-TRIMETHOPRIM 800-160 MG PO TABS: 1.0000 | ORAL_TABLET | Freq: Two times a day (BID) | ORAL | 0 refills | Status: AC

## 2022-06-24 MED ORDER — TRAMADOL HCL 50 MG PO TABS: 50.0000 mg | ORAL_TABLET | Freq: Four times a day (QID) | ORAL | Status: DC | PRN

## 2022-06-24 MED ORDER — CEFAZOLIN SODIUM-DEXTROSE 2-4 GM/100ML-% IV SOLN
2.0000 g | INTRAVENOUS | Status: AC
  Administered 2022-06-24: 2 g via INTRAVENOUS
  Filled 2022-06-24: qty 100

## 2022-06-24 MED ORDER — HYDROMORPHONE HCL 1 MG/ML IJ SOLN
INTRAMUSCULAR | Status: DC | PRN
  Administered 2022-06-24 (×2): .5 mg via INTRAVENOUS

## 2022-06-24 MED ORDER — ONDANSETRON HCL 4 MG/2ML IJ SOLN
INTRAMUSCULAR | Status: DC | PRN
  Administered 2022-06-24: 4 mg via INTRAVENOUS

## 2022-06-24 MED ORDER — PROPOFOL 10 MG/ML IV BOLUS
INTRAVENOUS | Status: AC
  Filled 2022-06-24: qty 20

## 2022-06-24 MED ORDER — ACETAMINOPHEN 10 MG/ML IV SOLN
INTRAVENOUS | Status: AC
  Administered 2022-06-24: 1000 mg via INTRAVENOUS
  Filled 2022-06-24: qty 100

## 2022-06-24 MED ORDER — LACTATED RINGERS IR SOLN
Status: DC | PRN
  Administered 2022-06-24: 1000 mL

## 2022-06-24 MED ORDER — SODIUM CHLORIDE 0.9 % IV BOLUS
1000.0000 mL | Freq: Once | INTRAVENOUS | Status: AC
  Administered 2022-06-24: 1000 mL via INTRAVENOUS

## 2022-06-24 MED ORDER — ACETAMINOPHEN 10 MG/ML IV SOLN
INTRAVENOUS | Status: DC | PRN
  Administered 2022-06-24: 1000 mg via INTRAVENOUS

## 2022-06-24 MED ORDER — SUGAMMADEX SODIUM 200 MG/2ML IV SOLN
INTRAVENOUS | Status: DC | PRN
  Administered 2022-06-24: 200 mg via INTRAVENOUS

## 2022-06-24 MED ORDER — HYDRALAZINE HCL 20 MG/ML IJ SOLN: 2.0000 mg | INTRAMUSCULAR | Status: DC | PRN

## 2022-06-24 MED ORDER — FLEET ENEMA 7-19 GM/118ML RE ENEM: 1.0000 | ENEMA | Freq: Once | RECTAL | Status: DC

## 2022-06-24 SURGICAL SUPPLY — 70 items
ADH SKN CLS APL DERMABOND .7 (GAUZE/BANDAGES/DRESSINGS) ×2
APL PRP STRL LF DISP 70% ISPRP (MISCELLANEOUS) ×2
APL SWBSTK 6 STRL LF DISP (MISCELLANEOUS) ×2
APPLICATOR COTTON TIP 6 STRL (MISCELLANEOUS) ×2 IMPLANT
APPLICATOR COTTON TIP 6IN STRL (MISCELLANEOUS) ×2
BAG COUNTER SPONGE SURGICOUNT (BAG) IMPLANT
BAG SPNG CNTER NS LX DISP (BAG)
CATH FOLEY 2WAY SLVR 18FR 30CC (CATHETERS) ×2 IMPLANT
CATH TIEMANN FOLEY 18FR 5CC (CATHETERS) ×2 IMPLANT
CHLORAPREP W/TINT 26 (MISCELLANEOUS) ×2 IMPLANT
CLIP LIGATING HEM O LOK PURPLE (MISCELLANEOUS) ×2 IMPLANT
COVER SURGICAL LIGHT HANDLE (MISCELLANEOUS) ×2 IMPLANT
COVER TIP SHEARS 8 DVNC (MISCELLANEOUS) ×2 IMPLANT
CUTTER ECHEON FLEX ENDO 45 340 (ENDOMECHANICALS) ×2 IMPLANT
DERMABOND ADVANCED .7 DNX12 (GAUZE/BANDAGES/DRESSINGS) ×2 IMPLANT
DRAIN CHANNEL RND F F (WOUND CARE) IMPLANT
DRAPE ARM DVNC X/XI (DISPOSABLE) ×8 IMPLANT
DRAPE COLUMN DVNC XI (DISPOSABLE) ×2 IMPLANT
DRAPE SURG IRRIG POUCH 19X23 (DRAPES) ×2 IMPLANT
DRIVER NDL LRG 8 DVNC XI (INSTRUMENTS) ×4 IMPLANT
DRIVER NDLE LRG 8 DVNC XI (INSTRUMENTS) ×4 IMPLANT
DRSG TEGADERM 4X4.75 (GAUZE/BANDAGES/DRESSINGS) ×2 IMPLANT
ELECT PENCIL ROCKER SW 15FT (MISCELLANEOUS) ×2 IMPLANT
ELECT REM PT RETURN 15FT ADLT (MISCELLANEOUS) ×2 IMPLANT
FORCEPS BPLR LNG DVNC XI (INSTRUMENTS) ×2 IMPLANT
FORCEPS PROGRASP DVNC XI (FORCEP) ×2 IMPLANT
GAUZE 4X4 16PLY ~~LOC~~+RFID DBL (SPONGE) IMPLANT
GAUZE SPONGE 2X2 8PLY STRL LF (GAUZE/BANDAGES/DRESSINGS) IMPLANT
GAUZE SPONGE 4X4 12PLY STRL (GAUZE/BANDAGES/DRESSINGS) ×2 IMPLANT
GLOVE BIO SURGEON STRL SZ 6.5 (GLOVE) ×2 IMPLANT
GLOVE SURG LX STRL 7.5 STRW (GLOVE) ×4 IMPLANT
GOWN SRG XL LVL 4 BRTHBL STRL (GOWNS) ×2 IMPLANT
GOWN STRL NON-REIN XL LVL4 (GOWNS) ×2
GOWN STRL REUS W/ TWL XL LVL3 (GOWN DISPOSABLE) ×4 IMPLANT
GOWN STRL REUS W/TWL XL LVL3 (GOWN DISPOSABLE) ×4
HOLDER FOLEY CATH W/STRAP (MISCELLANEOUS) ×2 IMPLANT
IRRIG SUCT STRYKERFLOW 2 WTIP (MISCELLANEOUS) ×2
IRRIGATION SUCT STRKRFLW 2 WTP (MISCELLANEOUS) ×2 IMPLANT
IV LACTATED RINGERS 1000ML (IV SOLUTION) ×2 IMPLANT
KIT TURNOVER KIT A (KITS) IMPLANT
PACK ROBOT UROLOGY CUSTOM (CUSTOM PROCEDURE TRAY) ×2 IMPLANT
PAD POSITIONING PINK XL (MISCELLANEOUS) ×2 IMPLANT
RELOAD STAPLE 45 4.1 GRN THCK (STAPLE) ×2 IMPLANT
SCISSORS MNPLR CVD DVNC XI (INSTRUMENTS) ×2 IMPLANT
SEAL UNIV 5-12 XI (MISCELLANEOUS) ×6 IMPLANT
SET CYSTO W/LG BORE CLAMP LF (SET/KITS/TRAYS/PACK) IMPLANT
SET TUBE SMOKE EVAC HIGH FLOW (TUBING) ×2 IMPLANT
SOL ELECTROSURG ANTI STICK (MISCELLANEOUS) ×2
SOL PREP POV-IOD 4OZ 10% (MISCELLANEOUS) ×2 IMPLANT
SOLUTION ELECTROSURG ANTI STCK (MISCELLANEOUS) ×2 IMPLANT
SPIKE FLUID TRANSFER (MISCELLANEOUS) ×2 IMPLANT
STAPLE RELOAD 45 GRN (STAPLE) ×2 IMPLANT
STAPLE RELOAD 45MM GREEN (STAPLE) ×2
SUT ETHILON 3 0 PS 1 (SUTURE) ×2 IMPLANT
SUT MNCRL AB 4-0 PS2 18 (SUTURE) ×4 IMPLANT
SUT V-LOC BARB 180 2/0GR6 GS22 (SUTURE) ×2
SUT VIC AB 0 CT1 27 (SUTURE) ×2
SUT VIC AB 0 CT1 27XBRD ANTBC (SUTURE) ×2 IMPLANT
SUT VIC AB 3-0 SH 27 (SUTURE) ×2
SUT VIC AB 3-0 SH 27XBRD (SUTURE) IMPLANT
SUT VICRYL 0 UR6 27IN ABS (SUTURE) ×4 IMPLANT
SUT VLOC BARB 180 ABS3/0GR12 (SUTURE) ×4
SUTURE V-LC BRB 180 2/0GR6GS22 (SUTURE) ×2 IMPLANT
SUTURE VLOC BRB 180 ABS3/0GR12 (SUTURE) ×4 IMPLANT
SYS BAG RETRIEVAL 10MM (BASKET) ×2
SYSTEM BAG RETRIEVAL 10MM (BASKET) IMPLANT
TOWEL OR NON WOVEN STRL DISP B (DISPOSABLE) ×2 IMPLANT
TROCAR Z THREAD OPTICAL 12X100 (TROCAR) IMPLANT
TROCAR Z-THREAD FIOS 5X100MM (TROCAR) IMPLANT
WATER STERILE IRR 1000ML POUR (IV SOLUTION) ×2 IMPLANT

## 2022-06-24 NOTE — H&P (Signed)
Pt presents today for pre-operative history and physical exam in anticipation of robotic assisted lap radical prostatectomy with bilateral pelvic lymph node dissection by Dr. Marlou Porch on 06/24/22. He is doing well and is without complaint.   Pt denies F/C, HA, CP, SOB, N/V, diarrhea/constipation, back pain, flank pain, hematuria, and dysuria.     HX:     CC: f/u to monitor Prostate Cancer  HPI: Scott Hines is a 62 year-old male established patient who is here for interval evaluation of his prostate cancer.  The patient has no issues with erectile function or voiding symptoms. He was treated for prostatitis at 1 point in the not so distant future which recovered quickly. He also had a left distal ureteral stone removed concomitantly with his prostate biopsy 2 weeks prior.   Today the patient is here for further discussion of his prostate cancer. We discussed his cancer diagnosis 6 weeks ago. He has had time to think about it and discuss with his friends and family. He is opted to proceed with radical prostatectomy.   He was diagnosed with prostate cancer in 11/27/2021. The patient's gleason score was: 3+4 = 7, 3/12 cores. Pretreatment PSA: 6.01.     ALLERGIES: No Allergies    MEDICATIONS: None   GU PSH: No GU PSH    NON-GU PSH: No Non-GU PSH    GU PMH: Prostate Cancer - 05/19/2022, - 03/26/2022, - 12/15/2021 History of prostate cancer - 03/26/2022 Elevated PSA - 10/13/2021, - 04/14/2021, - 01/27/2021 Hydronephrosis - 10/13/2021 Acute prostatitis - 01/27/2021 Peyronies Disease, Peyronie's disease - 2014      PMH Notes:  1898-03-01 00:00:00 - Note: Normal Routine History And Physical Adult   NON-GU PMH: Muscle weakness (generalized) - 05/19/2022 Hypercholesterolemia Hypertension    FAMILY HISTORY: Diverticulitis Of Colon - Mother Family Health Status - Father alive at age 49 - Runs In Family Family Health Status - Mother's Age - Runs In Family Family Health Status Number - Runs  In Family Heart Disease - Father Hypertension - Father Kidney Stones - Father nephrolithiasis - Father   SOCIAL HISTORY: Marital Status: Married Preferred Language: English; Ethnicity: Not Hispanic Or Latino; Race: White Current Smoking Status: Patient has never smoked.   Tobacco Use Assessment Completed: Used Tobacco in last 30 days? Does not use smokeless tobacco. Drinks 2 drinks per day.  Does not use drugs. Drinks 1 caffeinated drink per day. Has not had a blood transfusion.     Notes: Never Smoked, Caffeine Use, Marital History - Currently Married, Alcohol Use, Occupation:  [employer] beer and wine (couple of beers per night)   REVIEW OF SYSTEMS:    GU Review Male:   Patient denies frequent urination, hard to postpone urination, burning/ pain with urination, get up at night to urinate, leakage of urine, stream starts and stops, trouble starting your stream, have to strain to urinate , erection problems, and penile pain.  Gastrointestinal (Upper):   Patient denies vomiting, indigestion/ heartburn, and nausea.  Gastrointestinal (Lower):   Patient denies diarrhea and constipation.  Constitutional:   Patient denies fever, night sweats, weight loss, and fatigue.  Skin:   Patient denies skin rash/ lesion and itching.  Eyes:   Patient denies blurred vision and double vision.  Ears/ Nose/ Throat:   Patient denies sore throat and sinus problems.  Hematologic/Lymphatic:   Patient denies swollen glands and easy bruising.  Cardiovascular:   Patient denies leg swelling and chest pains.  Respiratory:   Patient denies cough and shortness of  breath.  Endocrine:   Patient denies excessive thirst.  Musculoskeletal:   Patient denies back pain and joint pain.  Neurological:   Patient denies headaches and dizziness.  Psychologic:   Patient denies depression and anxiety.   VITAL SIGNS:      06/01/2022 03:29 PM  BP 143/88 mmHg  Pulse 77 /min  Temperature 98.0 F / 36.6 C   MULTI-SYSTEM PHYSICAL  EXAMINATION:    Constitutional: Well-nourished. No physical deformities. Normally developed. Good grooming.  Neck: Neck symmetrical, not swollen. Normal tracheal position.  Respiratory: Normal breath sounds. No labored breathing, no use of accessory muscles.   Cardiovascular: Regular rate and rhythm. No murmur, no gallop.   Lymphatic: No enlargement of neck, axillae, groin.  Skin: No paleness, no jaundice, no cyanosis. No lesion, no ulcer, no rash.  Neurologic / Psychiatric: Oriented to time, oriented to place, oriented to person. No depression, no anxiety, no agitation.  Gastrointestinal: No mass, no tenderness, no rigidity, non obese abdomen.  Eyes: Normal conjunctivae. Normal eyelids.  Ears, Nose, Mouth, and Throat: Left ear no scars, no lesions, no masses. Right ear no scars, no lesions, no masses. Nose no scars, no lesions, no masses. Normal hearing. Normal lips.  Musculoskeletal: Normal gait and station of head and neck.     Complexity of Data:  Records Review:   Previous Patient Records  Urine Test Review:   Urinalysis   06/01/22  Urinalysis  Urine Appearance Clear   Urine Color Yellow   Urine Glucose Neg mg/dL  Urine Bilirubin Neg mg/dL  Urine Ketones Neg mg/dL  Urine Specific Gravity 1.025   Urine Blood Trace ery/uL  Urine pH 5.5   Urine Protein Neg mg/dL  Urine Urobilinogen 0.2 mg/dL  Urine Nitrites Neg   Urine Leukocyte Esterase Neg leu/uL  Urine WBC/hpf NS (Not Seen)   Urine RBC/hpf 0 - 2/hpf   Urine Epithelial Cells NS (Not Seen)   Urine Bacteria Rare (0-9/hpf)   Urine Mucous Not Present   Urine Yeast NS (Not Seen)   Urine Trichomonas Not Present   Urine Cystals NS (Not Seen)   Urine Casts NS (Not Seen)   Urine Sperm Not Present    PROCEDURES:          Urinalysis w/Scope - 81001 Dipstick Dipstick Cont'd Micro  Color: Yellow Bilirubin: Neg mg/dL WBC/hpf: NS (Not Seen)  Appearance: Clear Ketones: Neg mg/dL RBC/hpf: 0 - 2/hpf  Specific Gravity: 1.025 Blood:  Trace ery/uL Bacteria: Rare (0-9/hpf)  pH: 5.5 Protein: Neg mg/dL Cystals: NS (Not Seen)  Glucose: Neg mg/dL Urobilinogen: 0.2 mg/dL Casts: NS (Not Seen)    Nitrites: Neg Trichomonas: Not Present    Leukocyte Esterase: Neg leu/uL Mucous: Not Present      Epithelial Cells: NS (Not Seen)      Yeast: NS (Not Seen)      Sperm: Not Present    ASSESSMENT:      ICD-10 Details  1 GU:   Prostate Cancer - C61    PLAN:           Schedule Return Visit/Planned Activity: Keep Scheduled Appointment - Schedule Surgery          Document Letter(s):  Created for Patient: Clinical Summary         Notes:   There are no changes in the patients history or physical exam since last evaluation by Dr. Marlou Porch. Pt is scheduled to undergo RALP with BPLND on 06/24/22.   All pt's questions were answered to the best  of my ability.

## 2022-06-24 NOTE — Interval H&P Note (Signed)
History and Physical Interval Note:  06/24/2022 7:20 AM  Scott Hines  has presented today for surgery, with the diagnosis of PROSTATE CANCER.  The various methods of treatment have been discussed with the patient and family. After consideration of risks, benefits and other options for treatment, the patient has consented to  Procedure(s): XI ROBOTIC ASSISTED LAPAROSCOPIC RADICAL PROSTATECTOMY (N/A) XI ROBOTIC ASSISTED BILATER PELVIC LYMPH NODE DISSECTION (Bilateral) as a surgical intervention.  The patient's history has been reviewed, patient examined, no change in status, stable for surgery.  I have reviewed the patient's chart and labs.  Questions were answered to the patient's satisfaction.     Crist Fat

## 2022-06-24 NOTE — Op Note (Signed)
Preoperative diagnosis:  Prostate Cancer   Postoperative diagnosis:  same   Procedure: Robotic assisted laparoscopic radical prostatectomy Bilateral pelvic lymph node dissection  Surgeon: Crist Fat, MD Resident Surgeon: Caro Hight, MD First Assistant: Harrie Foreman, PA  Anesthesia: General  Complications: None  Intraoperative findings:  bilateral nerve spare, tight urethrovesical anastomosis  EBL: 400  Specimens:  #1.  Prostate and seminal vesicals #2.  Bilateral pelvic lymph nodes  Indication: Scott Hines is a 62 y.o. patient with prostate cancer.  After reviewing the management options for treatment, he elected to proceed with the removal of his prostate. We have discussed the potential benefits and risks of the procedure, side effects of the proposed treatment, the likelihood of the patient achieving the goals of the procedure, and any potential problems that might occur during the procedure or recuperation. Informed consent has been obtained.  Description of procedure:  The patient was consented in the preoperative holding area. He was in brought back to the operating room placed the table in supine position. General anesthesia was then induced and endotracheal tube was inserted. He was then placed in dorsolithotomy position and placed in steep Trendelenburg. He was then prepped and draped in the routine sterile fashion. We, the first assistant and I, then began by making a 10 mm incision supraumbilical midline incision the skin down through into the peritoneum. Then placed a 8 mm trocar. I then inflated the abdomen and inserted the 0 robotic lens. We then placed 2 additional a 8 millimeter trochars in the patient's left lower abdomen proximally 9 cm apart and 2 trochars on the patient's right lower abdomen, one was an 8 mm trocar and the one most lateral was a 12 mm trocar which was used as the assistant port. A 5 mm trocar was placed by triangulating the 2 right  lateral ports as a second assistant port. These ports were all placed under visual guidance. Once the ports were noted to be satisfactory position the robot was docked. We started with the 0 lens, monopolar scissors in the right hand and the [place] forceps the left hand as well as a fenestrated grasper as the third arm on the left-hand side.   We, the first assistant and I,  began our dissection the posterior plane incising the peritoneum at the level of the vas deferens. Isolated the left vas deferens and dissected it proximally towards the spermatic cord for 5 cm prior to ligating it. Then used this as traction to isolate the left the seminal vesicle which was then undressed bluntly and completely dissected out, all vessels were cauterized with a combination of bipolar and the monopolar scissors. We then turned our attention to the right side and similarly dissected out the right vas deferens and seminal vesicle. Once the SVs had been freed, we turned our attention to the posterior plane and bluntly dissected the tissue between the rectum and the posterior wall of the prostate bluntly out towards the apex.    At this point the bladder was taken down starting at the urachal remnant with a combination of both blunt dissection and sharp dissection using monopolar cautery the bladder was dropped down in the usual fashion to the medial umbilical ligaments laterally and the dorsal vein of the prostate anteriorly creating our space of Retzius. We then turned our attention to the endopelvic fascia which was incised laterally starting on the patient's right-hand side the levator muscles were pushed off the prostate laterally up towards the dorsal vein  complex on the right-hand side. This process was then repeated on the left-hand side and a nice notch was created for the dorsal vein. I then used a 45mm stapler to staple the dorsal vein.   We,the first assistant and I, then located the bladder neck at the  vesicoprostatic junction and using the monopolar scissors dissected down through the perivesical tissues and the bladder neck down to the prostatic urethra. The catheter was then deflated and pulled through our urethral opening and then used to retract the prostate anteriorly for the posterior bladder neck dissection. Once through the bladder neck and into the posterior plane of the prostate, the SVs were brought through the opening. The left pedicle was then isolated and systematically ligated with Weck clips and scissors. The nerve bundle was then peeled off the posterior lateral aspect of the prostate and bluntly dissected away off the prostate.  This was then repeated on the right side.    I then came down through the dorsal venous complex anteriorly down to the membranous urethra using the monopolar. Once down to the urethra, it was transected sharply and the apex of the prostate was then dissected off the levator and rectourethralis muscles. Once the apex of the prostate had been dissected free we came back to the base of the prostate and bluntly push the rectum and nerve vascular bundle off the prostate the patient's left and used clips on the patient's right to free the prostate. Once the prostate was free it was placed off to the side. The pelvis was then irrigated with normal saline and noted to be relatively hemostatic.  Attention was then turned to the right pelvic sidewall. The fibrofatty tissue between the external iliac vein, confluence of the iliac vessels, hypogastric artery, and Cooper's ligament was dissected free from the pelvic sidewall with care to preserve the obturator nerve. Weck clips were used for lymphostasis and hemostasis. An identical procedure was performed on the contralateral side and the lymphatic packets were removed for permanent pathologic analysis.  The prostate and both lymph node tissues were placed in the Endo Catch bag and the string brought to the 5 mm port.    The  vesicourethral anastomosis was then completed with 2 interlocking 3-0 V. lock sutures running the anastomosis in the 6:00 position to the 12:00 position on each side and then tying it off on the top. The final catheter was then passed through the patient's urethra and into the bladder and 120 cc was instilled into the bladder to test the anastomosis. As there was no leak a 1 [place] Blake drain was passed through the left lateral port and placed around the vesicourethral anastomosis. A 12 mm assistant port on the right lateral side was then closed with 0 Vicryl with the help of the [employer] needle. The 12 mm midline infraumbilical incision was then extended another centimeter taken down and the fascia opened to remove the Endo Catch bag with the prostate specimen. The fascia was then closed with a 0 Vicryl and all skin ports were closed with 4-0 Monocryl in a subcutaneous fashion. Dermabond glue was then applied to the incisions. The drain was then secured to the skin with a 0 nylon stitch and dressing applied.   At the end of the case all laps needles and sponges had been accounted for. There no immediate complications. The patient returned to the PACU in stable condition.

## 2022-06-24 NOTE — Progress Notes (Signed)
Pt walked with a walker from his room to nearest nursing station and provided an incentive spirometer. Changed JP drain drs and completed foley care.

## 2022-06-24 NOTE — Anesthesia Procedure Notes (Addendum)
Procedure Name: Intubation Date/Time: 06/24/2022 7:46 AM  Performed by: Lovie Chol, CRNAPre-anesthesia Checklist: Patient identified, Emergency Drugs available, Suction available and Patient being monitored Patient Re-evaluated:Patient Re-evaluated prior to induction Oxygen Delivery Method: Circle System Utilized Preoxygenation: Pre-oxygenation with 100% oxygen Induction Type: IV induction Ventilation: Mask ventilation without difficulty Laryngoscope Size: Miller and 3 Grade View: Grade I Tube type: Oral Tube size: 7.5 mm Number of attempts: 1 Airway Equipment and Method: Stylet Placement Confirmation: ETT inserted through vocal cords under direct vision, positive ETCO2 and breath sounds checked- equal and bilateral Secured at: 21 cm Tube secured with: Tape Dental Injury: Teeth and Oropharynx as per pre-operative assessment

## 2022-06-24 NOTE — Plan of Care (Signed)
  Problem: Education: Goal: Knowledge of the procedure and recovery process will improve Outcome: Progressing   Problem: Pain Management: Goal: General experience of comfort will improve Outcome: Progressing   Problem: Skin Integrity: Goal: Demonstration of wound healing without infection will improve Outcome: Progressing   Problem: Activity: Goal: Risk for activity intolerance will decrease Outcome: Progressing   Problem: Coping: Goal: Level of anxiety will decrease Outcome: Progressing

## 2022-06-24 NOTE — Transfer of Care (Signed)
Immediate Anesthesia Transfer of Care Note  Patient: Scott Hines  Procedure(s) Performed: XI ROBOTIC ASSISTED LAPAROSCOPIC RADICAL PROSTATECTOMY XI ROBOTIC ASSISTED BILATER PELVIC LYMPH NODE DISSECTION (Bilateral)  Patient Location: PACU  Anesthesia Type:General  Level of Consciousness: awake, oriented, and patient cooperative  Airway & Oxygen Therapy: Patient Spontanous Breathing and Patient connected to face mask oxygen  Post-op Assessment: Report given to RN and Post -op Vital signs reviewed and stable  Post vital signs: Reviewed  Last Vitals:  Vitals Value Taken Time  BP 136/95 06/24/22 1138  Temp    Pulse 76 06/24/22 1144  Resp 18 06/24/22 1144  SpO2 99 % 06/24/22 1144  Vitals shown include unvalidated device data.  Last Pain:  Vitals:   06/24/22 0611  TempSrc:   PainSc: 0-No pain         Complications: No notable events documented.

## 2022-06-24 NOTE — Discharge Instructions (Addendum)

## 2022-06-24 NOTE — Anesthesia Postprocedure Evaluation (Signed)
Anesthesia Post Note  Patient: Scott Hines  Procedure(s) Performed: XI ROBOTIC ASSISTED LAPAROSCOPIC RADICAL PROSTATECTOMY XI ROBOTIC ASSISTED BILATER PELVIC LYMPH NODE DISSECTION (Bilateral)     Patient location during evaluation: PACU Anesthesia Type: General Level of consciousness: awake and alert Pain management: pain level controlled Vital Signs Assessment: post-procedure vital signs reviewed and stable Respiratory status: spontaneous breathing, nonlabored ventilation, respiratory function stable and patient connected to nasal cannula oxygen Cardiovascular status: blood pressure returned to baseline and stable Postop Assessment: no apparent nausea or vomiting Anesthetic complications: no   No notable events documented.  Last Vitals:  Vitals:   06/24/22 1200 06/24/22 1215  BP: (!) 140/96 (!) 148/97  Pulse: 72 72  Resp: 12 12  Temp:    SpO2: 100% 100%    Last Pain:  Vitals:   06/24/22 1215  TempSrc:   PainSc: 0-No pain                 Tamirra Sienkiewicz

## 2022-06-25 ENCOUNTER — Encounter (HOSPITAL_COMMUNITY): Payer: Self-pay | Admitting: Urology

## 2022-06-25 DIAGNOSIS — C61 Malignant neoplasm of prostate: Secondary | ICD-10-CM | POA: Diagnosis not present

## 2022-06-25 LAB — BASIC METABOLIC PANEL
Anion gap: 7 (ref 5–15)
BUN: 9 mg/dL (ref 6–20)
CO2: 23 mmol/L (ref 22–32)
Calcium: 7.2 mg/dL — ABNORMAL LOW (ref 8.9–10.3)
Chloride: 101 mmol/L (ref 98–111)
Creatinine, Ser: 0.76 mg/dL (ref 0.61–1.24)
GFR, Estimated: >60 mL/min (ref >60)
Glucose, Bld: 125 mg/dL — ABNORMAL HIGH (ref 70–99)
Potassium: 3.4 mmol/L — ABNORMAL LOW (ref 3.5–5.1)
Sodium: 131 mmol/L — ABNORMAL LOW (ref 135–145)

## 2022-06-25 LAB — HEMOGLOBIN AND HEMATOCRIT, BLOOD
HCT: 35.8 % — ABNORMAL LOW (ref 39.0–52.0)
Hemoglobin: 12.4 g/dL — ABNORMAL LOW (ref 13.0–17.0)

## 2022-06-25 NOTE — Discharge Summary (Signed)
Date of admission: 06/24/2022  Date of discharge: 06/25/2022  Admission diagnosis: prostate cancer  Discharge diagnosis: same  Secondary diagnoses:  Patient Active Problem List   Diagnosis Date Noted   Prostate cancer (HCC) 06/24/2022    Procedures performed: Procedure(s): XI ROBOTIC ASSISTED LAPAROSCOPIC RADICAL PROSTATECTOMY XI ROBOTIC ASSISTED BILATER PELVIC LYMPH NODE DISSECTION  History and Physical: For full details, please see admission history and physical. Briefly, Scott Hines is a 62 y.o. year old patient with prostate cancer.   Hospital Course: Patient tolerated the procedure well.  He was then transferred to the floor after an uneventful PACU stay.  His hospital course was uncomplicated.  On POD#1 he had met discharge criteria: was eating a regular diet, was up and ambulating independently,  pain was well controlled,  and was ready to for discharge.  Drain was removed prior to discharge.  NAD Vitals:   06/24/22 1716 06/24/22 2101 06/25/22 0100 06/25/22 0500  BP: 118/86 133/85 119/69 107/69  Pulse: 72 66 64 67  Resp: 18 14 18 14   Temp: 98.2 F (36.8 C) 98.6 F (37 C) 98.6 F (37 C) 98.2 F (36.8 C)  TempSrc: Oral Oral Oral Oral  SpO2: 99% 96% 97% 97%  Weight:      Height:        Intake/Output Summary (Last 24 hours) at 06/25/2022 0723 Last data filed at 06/25/2022 0554 Gross per 24 hour  Intake 3079.67 ml  Output 2590 ml  Net 489.67 ml  Non labored breathing Abdomen is soft, incisions are c/d/I Extremities are symmetric Foley draining clear yellow urine.  Laboratory values:  Recent Labs    06/24/22 1151 06/25/22 0423  HGB 14.1 12.4*  HCT 42.5 35.8*   Recent Labs    06/25/22 0423  NA 131*  K 3.4*  CL 101  CO2 23  GLUCOSE 125*  BUN 9  CREATININE 0.76  CALCIUM 7.2*   No results for input(s): "LABPT", "INR" in the last 72 hours. No results for input(s): "LABURIN" in the last 72 hours. Results for orders placed or performed during the  hospital encounter of 06/15/22  Urine Culture     Status: None   Collection Time: 06/15/22 10:15 AM   Specimen: Urine, Clean Catch  Result Value Ref Range Status   Specimen Description   Final    URINE, CLEAN CATCH Performed at [facility], [address], [place], [place] [address]    Special Requests   Final    NONE Performed at [facility], [address], [place], [place] [address]    Culture   Final    NO GROWTH Performed at [facility], [address], [place], [place] [address]    Report Status 06/16/2022 FINAL  Final    Disposition: Home  Discharge instruction: The patient was instructed to be ambulatory but told to refrain from heavy lifting, strenuous activity, or driving.   Discharge medications:  Allergies as of 06/25/2022   Not on File      Medication List     TAKE these medications    Blink Tears 0.25 % Soln Generic drug: Polyethylene Glycol 400 Place 1 drop into both eyes as needed (itching eyes).   ibuprofen 200 MG tablet Commonly known as: ADVIL Take 400-600 mg by mouth every 6 (six) hours as needed for headache, mild pain or moderate pain.   sulfamethoxazole-trimethoprim 800-160 MG tablet Commonly known as: BACTRIM DS Take 1 tablet by mouth 2 (two) times  daily. Start taking one day prior to your appointment for your first follow-up and catheter removal.  Continue taking for three days.   traMADol 50 MG tablet Commonly known as: Ultram Take 1-2 tablets (50-100 mg total) by mouth every 6 (six) hours as needed for moderate pain.        Followup:   Follow-up Information     Hillery Aldo, NP Follow up on 07/01/2022.   Specialty: Nurse Practitioner Why: at 8:00 Contact information: [address] [place] [address] [place] [address] [phone]

## 2022-07-01 LAB — SURGICAL PATHOLOGY

## 2022-07-22 DIAGNOSIS — M6281 Muscle weakness (generalized): Secondary | ICD-10-CM | POA: Diagnosis not present

## 2022-07-22 DIAGNOSIS — N393 Stress incontinence (female) (male): Secondary | ICD-10-CM | POA: Diagnosis not present

## 2022-07-22 DIAGNOSIS — M62838 Other muscle spasm: Secondary | ICD-10-CM | POA: Diagnosis not present

## 2022-08-18 DIAGNOSIS — M6281 Muscle weakness (generalized): Secondary | ICD-10-CM | POA: Diagnosis not present

## 2022-08-18 DIAGNOSIS — M62838 Other muscle spasm: Secondary | ICD-10-CM | POA: Diagnosis not present

## 2022-08-18 DIAGNOSIS — N393 Stress incontinence (female) (male): Secondary | ICD-10-CM | POA: Diagnosis not present

## 2022-08-18 DIAGNOSIS — C61 Malignant neoplasm of prostate: Secondary | ICD-10-CM | POA: Diagnosis not present

## 2022-08-24 DIAGNOSIS — N393 Stress incontinence (female) (male): Secondary | ICD-10-CM | POA: Diagnosis not present

## 2022-09-17 DIAGNOSIS — M62838 Other muscle spasm: Secondary | ICD-10-CM | POA: Diagnosis not present

## 2022-09-17 DIAGNOSIS — N393 Stress incontinence (female) (male): Secondary | ICD-10-CM | POA: Diagnosis not present

## 2022-09-17 DIAGNOSIS — M6281 Muscle weakness (generalized): Secondary | ICD-10-CM | POA: Diagnosis not present

## 2023-01-26 DIAGNOSIS — J988 Other specified respiratory disorders: Secondary | ICD-10-CM | POA: Diagnosis not present

## 2023-02-14 DIAGNOSIS — D485 Neoplasm of uncertain behavior of skin: Secondary | ICD-10-CM | POA: Diagnosis not present

## 2023-02-14 DIAGNOSIS — L821 Other seborrheic keratosis: Secondary | ICD-10-CM | POA: Diagnosis not present

## 2023-02-14 DIAGNOSIS — L814 Other melanin hyperpigmentation: Secondary | ICD-10-CM | POA: Diagnosis not present

## 2023-02-14 DIAGNOSIS — D2371 Other benign neoplasm of skin of right lower limb, including hip: Secondary | ICD-10-CM | POA: Diagnosis not present

## 2023-02-14 DIAGNOSIS — Z872 Personal history of diseases of the skin and subcutaneous tissue: Secondary | ICD-10-CM | POA: Diagnosis not present

## 2023-02-14 DIAGNOSIS — L918 Other hypertrophic disorders of the skin: Secondary | ICD-10-CM | POA: Diagnosis not present

## 2023-03-03 DIAGNOSIS — E7800 Pure hypercholesterolemia, unspecified: Secondary | ICD-10-CM | POA: Diagnosis not present

## 2023-03-03 DIAGNOSIS — K760 Fatty (change of) liver, not elsewhere classified: Secondary | ICD-10-CM | POA: Diagnosis not present

## 2023-03-07 DIAGNOSIS — E7800 Pure hypercholesterolemia, unspecified: Secondary | ICD-10-CM | POA: Diagnosis not present

## 2023-03-07 DIAGNOSIS — N200 Calculus of kidney: Secondary | ICD-10-CM | POA: Diagnosis not present

## 2023-03-07 DIAGNOSIS — K760 Fatty (change of) liver, not elsewhere classified: Secondary | ICD-10-CM | POA: Diagnosis not present

## 2023-03-07 DIAGNOSIS — N486 Induration penis plastica: Secondary | ICD-10-CM | POA: Diagnosis not present

## 2023-03-07 DIAGNOSIS — C61 Malignant neoplasm of prostate: Secondary | ICD-10-CM | POA: Diagnosis not present

## 2023-03-07 DIAGNOSIS — F1010 Alcohol abuse, uncomplicated: Secondary | ICD-10-CM | POA: Diagnosis not present

## 2023-03-07 DIAGNOSIS — Z0000 Encounter for general adult medical examination without abnormal findings: Secondary | ICD-10-CM | POA: Diagnosis not present

## 2023-10-25 DIAGNOSIS — Z8546 Personal history of malignant neoplasm of prostate: Secondary | ICD-10-CM | POA: Diagnosis not present

## 2023-11-01 DIAGNOSIS — Z8546 Personal history of malignant neoplasm of prostate: Secondary | ICD-10-CM | POA: Diagnosis not present

## 2024-02-01 DIAGNOSIS — D1801 Hemangioma of skin and subcutaneous tissue: Secondary | ICD-10-CM | POA: Diagnosis not present

## 2024-02-01 DIAGNOSIS — L821 Other seborrheic keratosis: Secondary | ICD-10-CM | POA: Diagnosis not present

## 2024-02-01 DIAGNOSIS — L814 Other melanin hyperpigmentation: Secondary | ICD-10-CM | POA: Diagnosis not present
